# Patient Record
Sex: Female | Born: 1995 | Race: Black or African American | Hispanic: No | Marital: Single
Health system: Southern US, Community
[De-identification: ages and names within clinical notes are randomized; demographics above are authoritative.]

## PROBLEM LIST (undated history)

## (undated) ENCOUNTER — Inpatient Hospital Stay (HOSPITAL_COMMUNITY): Payer: Self-pay

## (undated) DIAGNOSIS — F32A Depression, unspecified: Secondary | ICD-10-CM

## (undated) DIAGNOSIS — F329 Major depressive disorder, single episode, unspecified: Secondary | ICD-10-CM

## (undated) DIAGNOSIS — F419 Anxiety disorder, unspecified: Secondary | ICD-10-CM

## (undated) DIAGNOSIS — J029 Acute pharyngitis, unspecified: Secondary | ICD-10-CM

## (undated) DIAGNOSIS — G43909 Migraine, unspecified, not intractable, without status migrainosus: Secondary | ICD-10-CM

## (undated) DIAGNOSIS — J45909 Unspecified asthma, uncomplicated: Secondary | ICD-10-CM

## (undated) HISTORY — DX: Acute pharyngitis, unspecified: J02.9

## (undated) HISTORY — DX: Major depressive disorder, single episode, unspecified: F32.9

## (undated) HISTORY — DX: Migraine, unspecified, not intractable, without status migrainosus: G43.909

## (undated) HISTORY — PX: CHOLECYSTECTOMY: SHX55

## (undated) HISTORY — DX: Depression, unspecified: F32.A

## (undated) HISTORY — PX: TYMPANOSTOMY TUBE PLACEMENT: SHX32

## (undated) HISTORY — DX: Unspecified asthma, uncomplicated: J45.909

---
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
## 2016-01-09 ENCOUNTER — Encounter (INDEPENDENT_AMBULATORY_CARE_PROVIDER_SITE_OTHER): Payer: Medicaid Other | Admitting: Neurology

## 2016-02-06 ENCOUNTER — Encounter (INDEPENDENT_AMBULATORY_CARE_PROVIDER_SITE_OTHER): Payer: Self-pay | Admitting: *Deleted

## 2016-02-11 NOTE — L&D Delivery Note (Signed)
Patient is 21 y.o. G1P0000 [redacted]w[redacted]d admitted for IOL 2/2 postdates. S/p IOL with foley bulb, cytotec, followed by Pitocin. AROM at 2030 (9/25).  Prenatal course also complicated by anxiety d/o, pseudotumor cerebri, GBS+.  Delivery Note At 8:38 AM a viable female was delivered via Vaginal, Spontaneous Delivery (Presentation: LOA).  APGAR: 0, 2,2; weight pending.   Placenta status: Intact.  Cord: 3V with the following complications: None.  Cord pH: pending  Anesthesia: Epidural  Episiotomy: None Lacerations: Sulcus;Labial bilateral Suture Repair: vicryl Est. Blood Loss (mL): 450  Upon arrival patient was complete and pushing. She pushed with good maternal effort to deliver a viable infant in cephalic position. Nuchal cord around neck, body, and leg,easily reduced. Baby delivered without difficulty (anterior shoulder delivered with ease), was noted to have good tone and place on maternal abdomen for oral suctioning, drying and stimulation. Baby noted to have poor tone and not breathing. Code APGAR called. Cord clamped and baby sent to warmer for resuscitation. Placenta delivered spontaneously with gentle cord traction. Fundus firm with massage and Pitocin. Perineum inspected and found to have bilateral labial and deep sulcal laceration, which was repaired with good hemostasis achieved. Counts of sharps, instruments, and lap pads were all correct.  Mom to postpartum.  Baby to NICU.  Caryl Ada, DO OB Fellow 11/05/2016, 9:29 AM

## 2016-02-21 ENCOUNTER — Ambulatory Visit (INDEPENDENT_AMBULATORY_CARE_PROVIDER_SITE_OTHER): Payer: Medicaid Other

## 2016-02-21 ENCOUNTER — Encounter: Payer: Self-pay | Admitting: *Deleted

## 2016-02-21 DIAGNOSIS — Z3201 Encounter for pregnancy test, result positive: Secondary | ICD-10-CM

## 2016-02-21 DIAGNOSIS — Z3200 Encounter for pregnancy test, result unknown: Secondary | ICD-10-CM

## 2016-02-21 LAB — POCT URINE PREGNANCY: Preg Test, Ur: POSITIVE — AB

## 2016-02-21 NOTE — Progress Notes (Signed)
Patient is in the office for pregnancy test, results positive, LMP 12-13-15, EDD 09-19-15. Patient is 4449w0d.

## 2016-03-03 ENCOUNTER — Encounter (HOSPITAL_COMMUNITY): Payer: Self-pay | Admitting: *Deleted

## 2016-03-03 ENCOUNTER — Inpatient Hospital Stay (HOSPITAL_COMMUNITY)
Admission: AD | Admit: 2016-03-03 | Discharge: 2016-03-03 | Disposition: A | Discharge status: Home / Self Care | Payer: Medicaid Other | Source: Ambulatory Visit | Attending: Obstetrics & Gynecology | Admitting: Obstetrics & Gynecology

## 2016-03-03 DIAGNOSIS — O99341 Other mental disorders complicating pregnancy, first trimester: Secondary | ICD-10-CM | POA: Insufficient documentation

## 2016-03-03 DIAGNOSIS — G43909 Migraine, unspecified, not intractable, without status migrainosus: Secondary | ICD-10-CM | POA: Diagnosis not present

## 2016-03-03 DIAGNOSIS — Z825 Family history of asthma and other chronic lower respiratory diseases: Secondary | ICD-10-CM | POA: Diagnosis not present

## 2016-03-03 DIAGNOSIS — Z8249 Family history of ischemic heart disease and other diseases of the circulatory system: Secondary | ICD-10-CM | POA: Insufficient documentation

## 2016-03-03 DIAGNOSIS — J45909 Unspecified asthma, uncomplicated: Secondary | ICD-10-CM | POA: Insufficient documentation

## 2016-03-03 DIAGNOSIS — Z833 Family history of diabetes mellitus: Secondary | ICD-10-CM | POA: Diagnosis not present

## 2016-03-03 DIAGNOSIS — Z7722 Contact with and (suspected) exposure to environmental tobacco smoke (acute) (chronic): Secondary | ICD-10-CM | POA: Insufficient documentation

## 2016-03-03 DIAGNOSIS — O209 Hemorrhage in early pregnancy, unspecified: Secondary | ICD-10-CM | POA: Insufficient documentation

## 2016-03-03 DIAGNOSIS — O99511 Diseases of the respiratory system complicating pregnancy, first trimester: Secondary | ICD-10-CM | POA: Insufficient documentation

## 2016-03-03 DIAGNOSIS — O4690 Antepartum hemorrhage, unspecified, unspecified trimester: Secondary | ICD-10-CM | POA: Diagnosis present

## 2016-03-03 DIAGNOSIS — O99351 Diseases of the nervous system complicating pregnancy, first trimester: Secondary | ICD-10-CM | POA: Insufficient documentation

## 2016-03-03 DIAGNOSIS — Z9109 Other allergy status, other than to drugs and biological substances: Secondary | ICD-10-CM | POA: Diagnosis not present

## 2016-03-03 DIAGNOSIS — Z820 Family history of epilepsy and other diseases of the nervous system: Secondary | ICD-10-CM | POA: Diagnosis not present

## 2016-03-03 DIAGNOSIS — Z818 Family history of other mental and behavioral disorders: Secondary | ICD-10-CM | POA: Diagnosis not present

## 2016-03-03 DIAGNOSIS — Z3A01 Less than 8 weeks gestation of pregnancy: Secondary | ICD-10-CM | POA: Insufficient documentation

## 2016-03-03 DIAGNOSIS — O26891 Other specified pregnancy related conditions, first trimester: Secondary | ICD-10-CM | POA: Diagnosis present

## 2016-03-03 DIAGNOSIS — O0281 Inappropriate change in quantitative human chorionic gonadotropin (hCG) in early pregnancy: Secondary | ICD-10-CM | POA: Diagnosis not present

## 2016-03-03 DIAGNOSIS — F329 Major depressive disorder, single episode, unspecified: Secondary | ICD-10-CM | POA: Insufficient documentation

## 2016-03-03 DIAGNOSIS — Z711 Person with feared health complaint in whom no diagnosis is made: Secondary | ICD-10-CM

## 2016-03-03 LAB — URINALYSIS, ROUTINE W REFLEX MICROSCOPIC
Bilirubin Urine: NEGATIVE
Glucose, UA: NEGATIVE mg/dL
Hgb urine dipstick: NEGATIVE
Ketones, ur: 20 mg/dL — AB
Leukocytes, UA: NEGATIVE
Nitrite: NEGATIVE
Protein, ur: NEGATIVE mg/dL
Specific Gravity, Urine: 1.017 (ref 1.005–1.030)
pH: 6 (ref 5.0–8.0)

## 2016-03-03 LAB — ABO/RH: ABO/RH(D): A POS

## 2016-03-03 LAB — HCG, QUANTITATIVE, PREGNANCY: hCG, Beta Chain, Quant, S: 15921 m[IU]/mL — ABNORMAL HIGH (ref <5)

## 2016-03-03 NOTE — MAU Note (Signed)
Pt seen @ [facility] yesterday - began cramping & bleeding on Saturday, had labwork & an U/S.  Was told she needed to F/U for more labwork.  Bleeding stopped about an hour after it started on Saturday.  Continues to have intermittent lower abd pain.

## 2016-03-03 NOTE — MAU Provider Note (Signed)
History     CSN: [id number]  Arrival date and time: 03/03/16 1356   First Provider Initiated Contact with Patient 03/03/16 1525      Chief Complaint  Patient presents with  . Abdominal Pain   HPI   Ms.Madison Guzman is a 21 y.o. female G1P0000 @ unknown gestation here in MAU for follow up. She was seen at another hospital over the weekend. She had sex on Saturday and shortly after started having vaginal bleeding. The bleeding was menstrual like and then stopped.  She went to [facility] on Saturday for a full workup. She had a quant that was 8,964. US showed IUP with gestational sac and yolk sac, no fetal pole. Gestational sac measured 6 weeks. She was told to follow up here for a beta hcg level today.   Currently she denies abdominal pain or vaginal bleeding   OB History    Gravida Para Term Preterm AB Living   1 0 0 0 0 0   SAB TAB Ectopic Multiple Live Births   0 0 0 0        Past Medical History:  Diagnosis Date  . Asthma   . Depression   . Migraine    Pt reports since age of 9  . Pharyngitis 01/08/2013   [facility] - strep neg    Past Surgical History:  Procedure Laterality Date  . TYMPANOSTOMY TUBE PLACEMENT Bilateral     Family History  Problem Relation Age of Onset  . Asthma Mother   . Migraines Mother   . Diabetes Maternal Grandmother   . Hyperlipidemia Maternal Grandmother   . Hypertension Maternal Grandmother   . Mental illness Maternal Grandmother   . Migraines Maternal Grandmother   . Seizures Father   . ADD / ADHD Brother     1 Younger brother has ADHD  . Mental illness Maternal Aunt     Bipolar  . ADD / ADHD Maternal Uncle     Social History  Substance Use Topics  . Smoking status: Passive Smoke Exposure - Never Smoker  . Smokeless tobacco: Never Used  . Alcohol use No    Allergies:  Allergies  Allergen Reactions  . Other     Seasonal    Prescriptions Prior to Admission  Medication Sig Dispense Refill Last Dose  .  albuterol (PROVENTIL HFA;VENTOLIN HFA) 108 (90 BASE) MCG/ACT inhaler Inhale 2 puffs by mouth every 4 hours as needed to treat wheezing (Patient not taking: Reported on 02/21/2016) 2 Inhaler 1 Not Taking  . amitriptyline (ELAVIL) 25 MG tablet TAKE 1& 1/2 TABLETS BY MOUTH AT BEDTIME (Patient not taking: Reported on 02/21/2016) 45 tablet 3 Not Taking  . lithium 300 MG tablet Take 300 mg by mouth 2 (two) times daily.   Not Taking  . Norethindrone-Ethinyl Estradiol-Fe Biphas (LO LOESTRIN FE) 1 MG-10 MCG / 10 MCG tablet Take 1 tablet by mouth daily. (Patient not taking: Reported on 02/21/2016) 1 Package 11 Not Taking  . Prenat w/o A-FeCbn-Meth-FA-DHA (PRENATE MINI) 29-0.6-0.4-350 MG CAPS Take 1 tablet by mouth daily. (Patient not taking: Reported on 02/21/2016) 30 capsule 12 Not Taking  . propranolol (INDERAL) 20 MG tablet Take 1 tablet (20 mg total) by mouth 2 (two) times daily. (Patient not taking: Reported on 02/21/2016) 60 tablet 1 Not Taking   Results for orders placed or performed during the hospital encounter of 03/03/16 (from the past 48 hour(s))  Urinalysis, Routine w reflex microscopic     Status: Abnormal  Collection Time: 03/03/16  2:20 PM  Result Value Ref Range   Color, Urine YELLOW YELLOW   APPearance HAZY (A) CLEAR   Specific Gravity, Urine 1.017 1.005 - 1.030   pH 6.0 5.0 - 8.0   Glucose, UA NEGATIVE NEGATIVE mg/dL   Hgb urine dipstick NEGATIVE NEGATIVE   Bilirubin Urine NEGATIVE NEGATIVE   Ketones, ur 20 (A) NEGATIVE mg/dL   Protein, ur NEGATIVE NEGATIVE mg/dL   Nitrite NEGATIVE NEGATIVE   Leukocytes, UA NEGATIVE NEGATIVE  hCG, quantitative, pregnancy     Status: Abnormal   Collection Time: 03/03/16  3:51 PM  Result Value Ref Range   hCG, Beta Chain, Quant, S 15,921 (H) <5 mIU/mL    Comment:          GEST. AGE      CONC.  (mIU/mL)   <=1 WEEK        5 - 50     2 WEEKS       50 - 500     3 WEEKS       100 - 10,000     4 WEEKS     1,000 - 30,000     5 WEEKS     3,500 - 115,000    6-8 WEEKS     12,000 - 270,000    12 WEEKS     15,000 - 220,000        FEMALE AND NON-PREGNANT FEMALE:     LESS THAN 5 mIU/mL   ABO/Rh     Status: None (Preliminary result)   Collection Time: 03/03/16  3:51 PM  Result Value Ref Range   ABO/RH(D) A POS     Review of Systems  Constitutional: Negative for chills and fever.  Gastrointestinal: Negative for abdominal pain.  Genitourinary: Negative for dysuria and vaginal bleeding.   Physical Exam   Blood pressure 129/76, pulse 96, temperature 98.6 F (37 C), temperature source Oral, resp. rate 18, height 5\' 4"  (1.626 m), weight 236 lb (107 kg), last menstrual period 12/13/2015.  Physical Exam  Constitutional: She is oriented to person, place, and time. She appears well-developed and well-nourished. No distress.  HENT:  Head: Normocephalic.  Respiratory: Effort normal.  GI: Soft.  Musculoskeletal: Normal range of motion.  Neurological: She is alert and oriented to person, place, and time.  Skin: Skin is warm. She is not diaphoretic.  Psychiatric: Her behavior is normal.    MAU Course  Procedures  None  MDM  A positive blood type.  Quant 16,10915,921. >60% rise in beta hcg level. Records reviewed in care everywhere.   Assessment and Plan   A:  1. Elevated level of quantitative hCG for gestational age in early pregnancy   2. Physically well but worried     P:  Discharge home in stable condition Start prenatal care.  Bleeding precautions  Return to MAU if symptoms worsen First trimester precautions   Duane LopeJennifer I Rasch, NP 03/03/2016 7:38 PM

## 2016-03-03 NOTE — Discharge Instructions (Signed)

## 2016-03-10 ENCOUNTER — Encounter: Payer: Medicaid Other | Admitting: Obstetrics and Gynecology

## 2016-03-11 ENCOUNTER — Encounter: Payer: Self-pay | Admitting: Obstetrics and Gynecology

## 2016-03-11 ENCOUNTER — Ambulatory Visit (INDEPENDENT_AMBULATORY_CARE_PROVIDER_SITE_OTHER): Payer: Medicaid Other | Admitting: Obstetrics and Gynecology

## 2016-03-11 ENCOUNTER — Other Ambulatory Visit (HOSPITAL_COMMUNITY)
Admission: RE | Admit: 2016-03-11 | Discharge: 2016-03-11 | Disposition: A | Discharge status: Home / Self Care | Payer: Medicaid Other | Source: Ambulatory Visit | Attending: Obstetrics and Gynecology | Admitting: Obstetrics and Gynecology

## 2016-03-11 VITALS — BP 124/76 | Wt 232.0 lb

## 2016-03-11 DIAGNOSIS — Z23 Encounter for immunization: Secondary | ICD-10-CM

## 2016-03-11 DIAGNOSIS — G932 Benign intracranial hypertension: Secondary | ICD-10-CM

## 2016-03-11 DIAGNOSIS — Z3689 Encounter for other specified antenatal screening: Secondary | ICD-10-CM | POA: Diagnosis not present

## 2016-03-11 DIAGNOSIS — A749 Chlamydial infection, unspecified: Secondary | ICD-10-CM

## 2016-03-11 DIAGNOSIS — J4540 Moderate persistent asthma, uncomplicated: Secondary | ICD-10-CM

## 2016-03-11 DIAGNOSIS — Z3490 Encounter for supervision of normal pregnancy, unspecified, unspecified trimester: Secondary | ICD-10-CM | POA: Insufficient documentation

## 2016-03-11 DIAGNOSIS — Z113 Encounter for screening for infections with a predominantly sexual mode of transmission: Secondary | ICD-10-CM | POA: Diagnosis present

## 2016-03-11 DIAGNOSIS — Z3401 Encounter for supervision of normal first pregnancy, first trimester: Secondary | ICD-10-CM | POA: Diagnosis not present

## 2016-03-11 DIAGNOSIS — O219 Vomiting of pregnancy, unspecified: Secondary | ICD-10-CM | POA: Insufficient documentation

## 2016-03-11 DIAGNOSIS — Z3A01 Less than 8 weeks gestation of pregnancy: Secondary | ICD-10-CM

## 2016-03-11 MED ORDER — DOXYLAMINE-PYRIDOXINE 10-10 MG PO TBEC: 2.0000 | DELAYED_RELEASE_TABLET | Freq: Every day | ORAL | 5 refills | Status: DC

## 2016-03-11 NOTE — Progress Notes (Signed)
Bedside U/S shows IUP with FHT of 145 BPM  CRL is 9.226mm  GA is 7w

## 2016-03-11 NOTE — Progress Notes (Signed)
Subjective:  Madison Guzman is a 21 y.o. G1P0000 at 6431w2d being seen today for her first OB visit. Some problems with N/V. H/O asthma, controlled without meds, H/O pseudo cerebri  followed by neurology.   She is currently monitored for the following issues for this high-risk pregnancy and has Depression; Asthma, moderate persistent, well-controlled; Allergic rhinitis; Migraine headache; Vitamin D deficiency; Migraine without aura and without status migrainosus, not intractable; Tension headache; Anxiety state, unspecified; Insomnia; Tinnitus; Chronic daily headache; Tinnitus of left ear; Pseudotumor cerebri; Pseudotumor cerebri syndrome; Supervision of normal pregnancy; and Nausea and vomiting during pregnancy on her problem list.  Patient reports nausea.   . Vag. Bleeding: Scant.  Movement: Absent. Denies leaking of fluid.   The following portions of the patient's history were reviewed and updated as appropriate: allergies, current medications, past family history, past medical history, past social history, past surgical history and problem list. Problem list updated.  Objective:   Vitals:   03/11/16 1503  BP: 124/76  Weight: 232 lb (105.2 kg)    Fetal Status: Fetal Heart Rate (bpm): 145   Movement: Absent     General:  Alert, oriented and cooperative. Patient is in no acute distress.  Skin: Skin is warm and dry. No rash noted.   Cardiovascular: Normal heart rate noted  Respiratory: Normal respiratory effort, no problems with respiration noted  Abdomen: Soft, gravid, appropriate for gestational age. Pain/Pressure: Present     Pelvic:  Cervical exam performed        Extremities: Normal range of motion.  Edema: None  Mental Status: Normal mood and affect. Normal behavior. Normal judgment and thought content.  Breast Sym supple no nipple d/c, masses or adenoapathy Urinalysis: Urine Protein: Negative Urine Glucose: Negative  Assessment and Plan:  Pregnancy: G1P0000 at 6731w2d  1. Less than [redacted]  weeks gestation of pregnancy Flu Vaccine Prenatal labs, first trimester screening and prenatal care reviewed - Prenatal Profile - Hemoglobin A1c - Culture, OB Urine - GC/Chlamydia probe amp ([facility] - Cystic fibrosis diagnostic study - Pain Mgmt, Profile 6 Conf w/o mM, U - Glucose  2. Encounter for supervision of normal first pregnancy in first trimester  - Sickle Cell Scr - US MFM Fetal Nuchal Translucency; Future  3. Pseudotumor cerebri syndrome   4. Asthma, moderate persistent, well-controlled   5. Nausea and vomiting during pregnancy Diet modification reviewed as well - Doxylamine-Pyridoxine (DICLEGIS) 10-10 MG TBEC; Take 2 tablets by mouth at bedtime. If symptoms persist, add one tablet in the morning and one in the afternoon  Dispense: 100 tablet; Refill: 5  Preterm labor symptoms and general obstetric precautions including but not limited to vaginal bleeding, contractions, leaking of fluid and fetal movement were reviewed in detail with the patient. Please refer to After Visit Summary for other counseling recommendations.  Return for OB visit.   Hermina StaggersMichael L Kelton Bultman, MD

## 2016-03-11 NOTE — Patient Instructions (Signed)
Morning Sickness Morning sickness is when you feel sick to your stomach (nauseous) during pregnancy. This nauseous feeling may or may not come with vomiting. It often occurs in the morning but can be a problem any time of day. Morning sickness is most common during the first trimester, but it may continue throughout pregnancy. While morning sickness is unpleasant, it is usually harmless unless you develop severe and continual vomiting (hyperemesis gravidarum). This condition requires more intense treatment. What are the causes? The cause of morning sickness is not completely known but seems to be related to normal hormonal changes that occur in pregnancy. What increases the risk? You are at greater risk if you:  Experienced nausea or vomiting before your pregnancy.  Had morning sickness during a previous pregnancy.  Are pregnant with more than one baby, such as twins. How is this treated? Do not use any medicines (prescription, over-the-counter, or herbal) for morning sickness without first talking to your health care provider. Your health care provider may prescribe or recommend:  Vitamin B6 supplements.  Anti-nausea medicines.  The herbal medicine ginger. Follow these instructions at home:  Only take over-the-counter or prescription medicines as directed by your health care provider.  Taking multivitamins before getting pregnant can prevent or decrease the severity of morning sickness in most women.  Eat a piece of dry toast or unsalted crackers before getting out of bed in the morning.  Eat five or six small meals a day.  Eat dry and bland foods (rice, baked potato). Foods high in carbohydrates are often helpful.  Do not drink liquids with your meals. Drink liquids between meals.  Avoid greasy, fatty, and spicy foods.  Get someone to cook for you if the smell of any food causes nausea and vomiting.  If you feel nauseous after taking prenatal vitamins, take the vitamins at  night or with a snack.  Snack on protein foods (nuts, yogurt, cheese) between meals if you are hungry.  Eat unsweetened gelatins for desserts.  Wearing an acupressure wristband (worn for sea sickness) may be helpful.  Acupuncture may be helpful.  Do not smoke.  Get a humidifier to keep the air in your house free of odors.  Get plenty of fresh air. Contact a health care provider if:  Your home remedies are not working, and you need medicine.  You feel dizzy or lightheaded.  You are losing weight. Get help right away if:  You have persistent and uncontrolled nausea and vomiting.  You pass out (faint). This information is not intended to replace advice given to you by your health care provider. Make sure you discuss any questions you have with your health care provider. Document Released: 03/20/2006 Document Revised: 07/05/2015 Document Reviewed: 07/14/2012 Elsevier Interactive Patient Education  2017 Else[employer] First Trimester of Pregnancy The first trimester of pregnancy is from week 1 until the end of week 12 (months 1 through 3). A week after a sperm fertilizes an egg, the egg will implant on the wall of the uterus. This embryo will begin to develop into a baby. Genes from you and your partner are forming the baby. The female genes determine whether the baby is a boy or a girl. At 6-8 weeks, the eyes and face are formed, and the heartbeat can be seen on ultrasound. At the end of 12 weeks, all the baby's organs are formed.  Now that you are pregnant, you will want to do everything you can to have a healthy baby. Two of the   most important things are to get good prenatal care and to follow your health care provider's instructions. Prenatal care is all the medical care you receive before the baby's birth. This care will help prevent, find, and treat any problems during the pregnancy and childbirth. BODY CHANGES Your body goes through many changes during pregnancy. The changes vary  from woman to woman.   You may gain or lose a couple of pounds at first.  You may feel sick to your stomach (nauseous) and throw up (vomit). If the vomiting is uncontrollable, call your health care provider.  You may tire easily.  You may develop headaches that can be relieved by medicines approved by your health care provider.  You may urinate more often. Painful urination may mean you have a bladder infection.  You may develop heartburn as a result of your pregnancy.  You may develop constipation because certain hormones are causing the muscles that push waste through your intestines to slow down.  You may develop hemorrhoids or swollen, bulging veins (varicose veins).  Your breasts may begin to grow larger and become tender. Your nipples may stick out more, and the tissue that surrounds them (areola) may become darker.  Your gums may bleed and may be sensitive to brushing and flossing.  Dark spots or blotches (chloasma, mask of pregnancy) may develop on your face. This will likely fade after the baby is born.  Your menstrual periods will stop.  You may have a loss of appetite.  You may develop cravings for certain kinds of food.  You may have changes in your emotions from day to day, such as being excited to be pregnant or being concerned that something may go wrong with the pregnancy and baby.  You may have more vivid and strange dreams.  You may have changes in your hair. These can include thickening of your hair, rapid growth, and changes in texture. Some women also have hair loss during or after pregnancy, or hair that feels dry or thin. Your hair will most likely return to normal after your baby is born. WHAT TO EXPECT AT YOUR PRENATAL VISITS During a routine prenatal visit:  You will be weighed to make sure you and the baby are growing normally.  Your blood pressure will be taken.  Your abdomen will be measured to track your baby's growth.  The fetal heartbeat  will be listened to starting around week 10 or 12 of your pregnancy.  Test results from any previous visits will be discussed. Your health care provider may ask you:  How you are feeling.  If you are feeling the baby move.  If you have had any abnormal symptoms, such as leaking fluid, bleeding, severe headaches, or abdominal cramping.  If you are using any tobacco products, including cigarettes, chewing tobacco, and electronic cigarettes.  If you have any questions. Other tests that may be performed during your first trimester include:  Blood tests to find your blood type and to check for the presence of any previous infections. They will also be used to check for low iron levels (anemia) and Rh antibodies. Later in the pregnancy, blood tests for diabetes will be done along with other tests if problems develop.  Urine tests to check for infections, diabetes, or protein in the urine.  An ultrasound to confirm the proper growth and development of the baby.  An amniocentesis to check for possible genetic problems.  Fetal screens for spina bifida and Down syndrome.  You may need   other tests to make sure you and the baby are doing well.  HIV (human immunodeficiency virus) testing. Routine prenatal testing includes screening for HIV, unless you choose not to have this test. HOME CARE INSTRUCTIONS  Medicines   Follow your health care provider's instructions regarding medicine use. Specific medicines may be either safe or unsafe to take during pregnancy.  Take your prenatal vitamins as directed.  If you develop constipation, try taking a stool softener if your health care provider approves. Diet   Eat regular, well-balanced meals. Choose a variety of foods, such as meat or vegetable-based protein, fish, milk and low-fat dairy products, vegetables, fruits, and whole grain breads and cereals. Your health care provider will help you determine the amount of weight gain that is right for  you.  Avoid raw meat and uncooked cheese. These carry germs that can cause birth defects in the baby.  Eating four or five small meals rather than three large meals a day may help relieve nausea and vomiting. If you start to feel nauseous, eating a few soda crackers can be helpful. Drinking liquids between meals instead of during meals also seems to help nausea and vomiting.  If you develop constipation, eat more high-fiber foods, such as fresh vegetables or fruit and whole grains. Drink enough fluids to keep your urine clear or pale yellow. Activity and Exercise   Exercise only as directed by your health care provider. Exercising will help you:  Control your weight.  Stay in shape.  Be prepared for labor and delivery.  Experiencing pain or cramping in the lower abdomen or low back is a good sign that you should stop exercising. Check with your health care provider before continuing normal exercises.  Try to avoid standing for long periods of time. Move your legs often if you must stand in one place for a long time.  Avoid heavy lifting.  Wear low-heeled shoes, and practice good posture.  You may continue to have sex unless your health care provider directs you otherwise. Relief of Pain or Discomfort   Wear a good support bra for breast tenderness.   Take warm sitz baths to soothe any pain or discomfort caused by hemorrhoids. Use hemorrhoid cream if your health care provider approves.   Rest with your legs elevated if you have leg cramps or low back pain.  If you develop varicose veins in your legs, wear support hose. Elevate your feet for 15 minutes, 3-4 times a day. Limit salt in your diet. Prenatal Care   Schedule your prenatal visits by the twelfth week of pregnancy. They are usually scheduled monthly at first, then more often in the last 2 months before delivery.  Write down your questions. Take them to your prenatal visits.  Keep all your prenatal visits as directed  by your health care provider. Safety   Wear your seat belt at all times when driving.  Make a list of emergency phone numbers, including numbers for family, friends, the hospital, and police and fire departments. General Tips   Ask your health care provider for a referral to a local prenatal education class. Begin classes no later than at the beginning of month 6 of your pregnancy.  Ask for help if you have counseling or nutritional needs during pregnancy. Your health care provider can offer advice or refer you to specialists for help with various needs.  Do not use hot tubs, steam rooms, or saunas.  Do not douche or use tampons or scented sanitary pads.    Do not cross your legs for long periods of time.  Avoid cat litter boxes and soil used by cats. These carry germs that can cause birth defects in the baby and possibly loss of the fetus by miscarriage or stillbirth.  Avoid all smoking, herbs, alcohol, and medicines not prescribed by your health care provider. Chemicals in these affect the formation and growth of the baby.  Do not use any tobacco products, including cigarettes, chewing tobacco, and electronic cigarettes. If you need help quitting, ask your health care provider. You may receive counseling support and other resources to help you quit.  Schedule a dentist appointment. At home, brush your teeth with a soft toothbrush and be gentle when you floss. SEEK MEDICAL CARE IF:   You have dizziness.  You have mild pelvic cramps, pelvic pressure, or nagging pain in the abdominal area.  You have persistent nausea, vomiting, or diarrhea.  You have a bad smelling vaginal discharge.  You have pain with urination.  You notice increased swelling in your face, hands, legs, or ankles. SEEK IMMEDIATE MEDICAL CARE IF:   You have a fever.  You are leaking fluid from your vagina.  You have spotting or bleeding from your vagina.  You have severe abdominal cramping or pain.  You  have rapid weight gain or loss.  You vomit blood or material that looks like coffee grounds.  You are exposed to German measles and have never had them.  You are exposed to fifth disease or chickenpox.  You develop a severe headache.  You have shortness of breath.  You have any kind of trauma, such as from a fall or a car accident. This information is not intended to replace advice given to you by your health care provider. Make sure you discuss any questions you have with your health care provider. Document Released: 01/21/2001 Document Revised: 02/17/2014 Document Reviewed: 12/07/2012 Elsevier Interactive Patient Education  2017 Elsevier Inc.  

## 2016-03-12 LAB — PRENATAL PROFILE (SOLSTAS)
Antibody Screen: NEGATIVE
Basophils Absolute: 0 cells/uL (ref 0–200)
Basophils Relative: 0 %
Eosinophils Absolute: 127 cells/uL (ref 15–500)
Eosinophils Relative: 1 %
HCT: 40.1 % (ref 35.0–45.0)
HIV 1&2 Ab, 4th Generation: NONREACTIVE
Hemoglobin: 13.4 g/dL (ref 11.7–15.5)
Hepatitis B Surface Ag: NEGATIVE
Lymphocytes Relative: 25 %
Lymphs Abs: 3175 cells/uL (ref 850–3900)
MCH: 28.2 pg (ref 27.0–33.0)
MCHC: 33.4 g/dL (ref 32.0–36.0)
MCV: 84.2 fL (ref 80.0–100.0)
MPV: 9.2 fL (ref 7.5–12.5)
Monocytes Absolute: 762 cells/uL (ref 200–950)
Monocytes Relative: 6 %
Neutro Abs: 8636 cells/uL — ABNORMAL HIGH (ref 1500–7800)
Neutrophils Relative %: 68 %
Platelets: 389 10*3/uL (ref 140–400)
RBC: 4.76 MIL/uL (ref 3.80–5.10)
RDW: 14.8 % (ref 11.0–15.0)
Rh Type: POSITIVE
Rubella: 1.04 Index — ABNORMAL HIGH (ref <0.90)
WBC: 12.7 10*3/uL — ABNORMAL HIGH (ref 3.8–10.8)

## 2016-03-12 LAB — HEMOGLOBIN A1C
Hgb A1c MFr Bld: 4.9 % (ref <5.7)
Mean Plasma Glucose: 94 mg/dL

## 2016-03-12 LAB — SICKLE CELL SCREEN: Sickle Cell Screen: NEGATIVE

## 2016-03-12 LAB — GLUCOSE, RANDOM: Glucose, Bld: 94 mg/dL (ref 65–99)

## 2016-03-13 LAB — PAIN MGMT, PROFILE 6 CONF W/O MM, U
6 Acetylmorphine: NEGATIVE ng/mL (ref <10)
Alcohol Metabolites: NEGATIVE ng/mL (ref <500)
Amphetamines: NEGATIVE ng/mL (ref <500)
Barbiturates: NEGATIVE ng/mL (ref <300)
Benzodiazepines: NEGATIVE ng/mL (ref <100)
Cocaine Metabolite: NEGATIVE ng/mL (ref <150)
Creatinine: >350 mg/dL (ref >=20.0)
Marijuana Metabolite: NEGATIVE ng/mL (ref <20)
Methadone Metabolite: NEGATIVE ng/mL (ref <100)
Opiates: NEGATIVE ng/mL (ref <100)
Oxidant: NEGATIVE ug/mL (ref <200)
Oxycodone: NEGATIVE ng/mL (ref <100)
Phencyclidine: NEGATIVE ng/mL (ref <25)
Please note:: 0
pH: 6.95 (ref 4.5–9.0)

## 2016-03-13 LAB — GC/CHLAMYDIA PROBE AMP (~~LOC~~) NOT AT ARMC
Chlamydia: POSITIVE — AB
Neisseria Gonorrhea: NEGATIVE

## 2016-03-13 LAB — CULTURE, OB URINE

## 2016-03-14 ENCOUNTER — Telehealth: Payer: Self-pay

## 2016-03-14 MED ORDER — AZITHROMYCIN 250 MG PO TABS: 250.0000 mg | ORAL_TABLET | Freq: Once | ORAL | 0 refills | Status: AC

## 2016-03-14 NOTE — Addendum Note (Signed)
Addended by: Kathie DikeSOLA, Eberardo Demello J on: 03/14/2016 08:19 AM   Modules accepted: Orders

## 2016-03-14 NOTE — Telephone Encounter (Signed)
I called pt to inform her that her results came back positive for Chlamydia. I told her I would be sending Azithromycin into her pharmacy. She informed me that she was seen at the hospital and they had already given her the medication. She took the medication two days prior to when we tested her for Chlamydia. Considering it takes about seven days to clear it up, I told her we would do a TOC in 4 weeks. I still informed her that her partner should be tested and treated if needed.

## 2016-03-21 LAB — CFVANTAGE CYSTIC FIB EXP SCREEN

## 2016-04-07 ENCOUNTER — Encounter (HOSPITAL_COMMUNITY): Payer: Self-pay | Admitting: Obstetrics and Gynecology

## 2016-04-08 ENCOUNTER — Other Ambulatory Visit (HOSPITAL_COMMUNITY)
Admission: RE | Admit: 2016-04-08 | Discharge: 2016-04-08 | Disposition: A | Discharge status: Home / Self Care | Payer: Medicaid Other | Source: Ambulatory Visit | Attending: Obstetrics & Gynecology | Admitting: Obstetrics & Gynecology

## 2016-04-08 ENCOUNTER — Encounter: Payer: Self-pay | Admitting: Obstetrics & Gynecology

## 2016-04-08 ENCOUNTER — Ambulatory Visit (INDEPENDENT_AMBULATORY_CARE_PROVIDER_SITE_OTHER): Payer: Medicaid Other | Admitting: Obstetrics & Gynecology

## 2016-04-08 VITALS — Wt 223.0 lb

## 2016-04-08 DIAGNOSIS — Z202 Contact with and (suspected) exposure to infections with a predominantly sexual mode of transmission: Secondary | ICD-10-CM

## 2016-04-08 DIAGNOSIS — A749 Chlamydial infection, unspecified: Secondary | ICD-10-CM

## 2016-04-08 DIAGNOSIS — O99211 Obesity complicating pregnancy, first trimester: Secondary | ICD-10-CM

## 2016-04-08 DIAGNOSIS — Z113 Encounter for screening for infections with a predominantly sexual mode of transmission: Secondary | ICD-10-CM | POA: Insufficient documentation

## 2016-04-08 DIAGNOSIS — O99210 Obesity complicating pregnancy, unspecified trimester: Secondary | ICD-10-CM

## 2016-04-08 DIAGNOSIS — O98811 Other maternal infectious and parasitic diseases complicating pregnancy, first trimester: Secondary | ICD-10-CM

## 2016-04-08 DIAGNOSIS — Z3401 Encounter for supervision of normal first pregnancy, first trimester: Secondary | ICD-10-CM

## 2016-04-08 DIAGNOSIS — E669 Obesity, unspecified: Secondary | ICD-10-CM

## 2016-04-08 DIAGNOSIS — O98311 Other infections with a predominantly sexual mode of transmission complicating pregnancy, first trimester: Secondary | ICD-10-CM

## 2016-04-08 MED ORDER — PROMETHAZINE HCL 25 MG PO TABS: 25.0000 mg | ORAL_TABLET | Freq: Four times a day (QID) | ORAL | 2 refills | Status: DC | PRN

## 2016-04-08 NOTE — Progress Notes (Signed)
Pt states that she had some slight bleeding since the last time she was here but hasn't had it lately.  --Madison Guzman, CMA   PRENATAL VISIT NOTE  Subjective:  Madison Guzman is a 21 y.o. G1P0000 at 4840w3d being seen today for ongoing prenatal care.  She is currently monitored for the following issues for this high-risk pregnancy and has Depression; Asthma, moderate persistent, well-controlled; Allergic rhinitis; Migraine headache; Vitamin D deficiency; Migraine without aura and without status migrainosus, not intractable; Tension headache; Anxiety state, unspecified; Insomnia; Tinnitus; Chronic daily headache; Tinnitus of left ear; Pseudotumor cerebri; Pseudotumor cerebri syndrome; Supervision of normal pregnancy; Nausea and vomiting during pregnancy; and Chlamydia infection affecting pregnancy on her problem list.  Patient reports light bleeding since last visit.  None currently.  Nausea and coudln't afford medications..   [name]. Vag. Bleeding: None.  Movement: Absent. Denies leaking of fluid.   The following portions of the patient's history were reviewed and updated as appropriate: allergies, current medications, past family history, past medical history, past social history, past surgical history and problem list. Problem list updated.  Objective:   Vitals:   04/08/16 1332  Weight: 223 lb (101.2 kg)    Fetal Status: Fetal Heart Rate (bpm): 160   Movement: Absent     General:  Alert, oriented and cooperative. Patient is in no acute distress.  Skin: Skin is warm and dry. No rash noted.   Cardiovascular: Normal heart rate noted  Respiratory: Normal respiratory effort, no problems with respiration noted  Abdomen: Soft, gravid, appropriate for gestational age. Pain/Pressure: Present     Pelvic:  Cervical exam deferred        Extremities: Normal range of motion.  Edema: None  Mental Status: Normal mood and affect. Normal behavior. Normal judgment and thought content.   Assessment and Plan:   Pregnancy: G1P0000 at 2940w3d  1. Encounter for supervision of normal first pregnancy in first trimester -First screen ordered -Phenergan ordered; if makes too tired will order Zofran.  Working on [employer] approval for [employer].  2. Exposure to chlamydia - GC[facility]Chlamydia [facility] amp [facility]  3.  Early glucola -Obese and history of elevated hgb A1C  4.  Headaches Refer to Donnella BiKaren Teague Clak   Preterm labor symptoms and general obstetric precautions including but not limited to vaginal bleeding, contractions, leaking of fluid and fetal movement were reviewed in detail with the patient. Please refer to After Visit Summary for other counseling recommendations.  Return in about 4 weeks (around 05/06/2016).   Madison DukesKelly H Sharline Lehane, MD

## 2016-04-09 DIAGNOSIS — O98819 Other maternal infectious and parasitic diseases complicating pregnancy, unspecified trimester: Secondary | ICD-10-CM

## 2016-04-09 DIAGNOSIS — A749 Chlamydial infection, unspecified: Secondary | ICD-10-CM | POA: Insufficient documentation

## 2016-04-10 LAB — GC/CHLAMYDIA PROBE AMP (~~LOC~~) NOT AT ARMC
Chlamydia: NEGATIVE
Neisseria Gonorrhea: NEGATIVE

## 2016-04-17 ENCOUNTER — Ambulatory Visit (HOSPITAL_COMMUNITY)
Admission: RE | Admit: 2016-04-17 | Discharge: 2016-04-17 | Disposition: A | Discharge status: Home / Self Care | Payer: Medicaid Other | Source: Ambulatory Visit | Attending: Obstetrics and Gynecology | Admitting: Obstetrics and Gynecology

## 2016-04-17 ENCOUNTER — Encounter (HOSPITAL_COMMUNITY): Payer: Self-pay

## 2016-04-17 DIAGNOSIS — Z3682 Encounter for antenatal screening for nuchal translucency: Secondary | ICD-10-CM | POA: Insufficient documentation

## 2016-04-17 DIAGNOSIS — O99211 Obesity complicating pregnancy, first trimester: Secondary | ICD-10-CM | POA: Diagnosis not present

## 2016-04-17 DIAGNOSIS — Z3401 Encounter for supervision of normal first pregnancy, first trimester: Secondary | ICD-10-CM

## 2016-04-17 DIAGNOSIS — Z3A12 12 weeks gestation of pregnancy: Secondary | ICD-10-CM | POA: Diagnosis not present

## 2016-04-21 ENCOUNTER — Other Ambulatory Visit: Payer: Self-pay

## 2016-04-22 NOTE — Progress Notes (Signed)
Erroneous encounter

## 2016-05-02 ENCOUNTER — Inpatient Hospital Stay (HOSPITAL_COMMUNITY)
Admission: AD | Admit: 2016-05-02 | Discharge: 2016-05-03 | Disposition: A | Discharge status: Home / Self Care | Payer: Medicaid Other | Source: Ambulatory Visit | Attending: Obstetrics & Gynecology | Admitting: Obstetrics & Gynecology

## 2016-05-02 ENCOUNTER — Encounter (HOSPITAL_COMMUNITY): Payer: Self-pay

## 2016-05-02 DIAGNOSIS — O26892 Other specified pregnancy related conditions, second trimester: Secondary | ICD-10-CM | POA: Insufficient documentation

## 2016-05-02 DIAGNOSIS — O210 Mild hyperemesis gravidarum: Secondary | ICD-10-CM | POA: Insufficient documentation

## 2016-05-02 DIAGNOSIS — R109 Unspecified abdominal pain: Secondary | ICD-10-CM | POA: Diagnosis present

## 2016-05-02 DIAGNOSIS — Z7722 Contact with and (suspected) exposure to environmental tobacco smoke (acute) (chronic): Secondary | ICD-10-CM | POA: Diagnosis not present

## 2016-05-02 DIAGNOSIS — O219 Vomiting of pregnancy, unspecified: Secondary | ICD-10-CM

## 2016-05-02 DIAGNOSIS — Z3A14 14 weeks gestation of pregnancy: Secondary | ICD-10-CM | POA: Diagnosis not present

## 2016-05-02 LAB — URINALYSIS, ROUTINE W REFLEX MICROSCOPIC
Bilirubin Urine: NEGATIVE
Glucose, UA: NEGATIVE mg/dL
Hgb urine dipstick: NEGATIVE
Ketones, ur: NEGATIVE mg/dL
Leukocytes, UA: NEGATIVE
Nitrite: NEGATIVE
Protein, ur: NEGATIVE mg/dL
Specific Gravity, Urine: 1.01 (ref 1.005–1.030)
pH: 6 (ref 5.0–8.0)

## 2016-05-02 NOTE — MAU Note (Signed)
Last night I had some cramping in lower abd. Today was worse and esp tonight. More on L side tonight. Hurts worse with movement and feels like I can't breathe. Have to take deep breaths but am not having trouble breathing. Pain "is annoying". Denies vag d/c or bleeding

## 2016-05-03 DIAGNOSIS — R109 Unspecified abdominal pain: Secondary | ICD-10-CM | POA: Diagnosis not present

## 2016-05-03 DIAGNOSIS — O26892 Other specified pregnancy related conditions, second trimester: Secondary | ICD-10-CM

## 2016-05-03 LAB — WET PREP, GENITAL
Clue Cells Wet Prep HPF POC: NONE SEEN
Sperm: NONE SEEN
Trich, Wet Prep: NONE SEEN
Yeast Wet Prep HPF POC: NONE SEEN

## 2016-05-03 LAB — CBC
HCT: 38.3 % (ref 36.0–46.0)
Hemoglobin: 13.3 g/dL (ref 12.0–15.0)
MCH: 28.4 pg (ref 26.0–34.0)
MCHC: 34.7 g/dL (ref 30.0–36.0)
MCV: 81.8 fL (ref 78.0–100.0)
Platelets: 294 10*3/uL (ref 150–400)
RBC: 4.68 MIL/uL (ref 3.87–5.11)
RDW: 14.1 % (ref 11.5–15.5)
WBC: 13.7 10*3/uL — ABNORMAL HIGH (ref 4.0–10.5)

## 2016-05-03 MED ORDER — ONDANSETRON HCL 4 MG PO TABS: 4.0000 mg | ORAL_TABLET | Freq: Three times a day (TID) | ORAL | 0 refills | Status: DC | PRN

## 2016-05-03 MED ORDER — PROMETHAZINE HCL 25 MG PO TABS
25.0000 mg | ORAL_TABLET | Freq: Once | ORAL | Status: AC
  Administered 2016-05-03: 25 mg via ORAL
  Filled 2016-05-03: qty 1

## 2016-05-03 NOTE — MAU Provider Note (Signed)
Chief Complaint: Abdominal Pain   None     SUBJECTIVE HPI: Madison Guzman is a 21 y.o. G1P0000 at [redacted]w[redacted]d by LMP pt of Kathryne Sharper who presents to maternity admissions reporting onset of abdominal cramping 2 days ago worsening tonight.  Her pain in in the center of her lower pelvis, but slightly more on the left lower side and is radiating in to her lower back.  She has nausea and takes Phenergan 25 mg PO Q 6 hours PRN but her nausea is unchanged and she denies recent vomiting.  There are no other associated symptoms. She has tried drinking more water and Tylenol but nothing has helped.  She denies vaginal bleeding, vaginal itching/burning, urinary symptoms, h/a, dizziness, or fever/chills.     HPI  Past Medical History:  Diagnosis Date  . Asthma   . Depression   . Migraine    Pt reports since age of 23  . Pharyngitis 01/08/2013   [facility] - strep neg   Past Surgical History:  Procedure Laterality Date  . TYMPANOSTOMY TUBE PLACEMENT Bilateral    Social History   Social History  . Marital status: Single    Spouse name: N/A  . Number of children: N/A  . Years of education: N/A   Occupational History  . Not on file.   Social History Main Topics  . Smoking status: Passive Smoke Exposure - Never Smoker  . Smokeless tobacco: Never Used  . Alcohol use No  . Drug use: No  . Sexual activity: Not Currently    Birth control/ protection: None     Comment: Patient stated that she does not always take the [facility] pill as prescribed 04/12/14 TW   Other Topics Concern  . Not on file   Social History Narrative   Kyona attends [employer]. She is doing well.   Generally stays at maternal grandmothers home across from parents.  Has her own room there and the 2 households interact well.   No current facility-administered medications on file prior to encounter.    Current Outpatient Prescriptions on File Prior to Encounter  Medication Sig Dispense Refill  .  promethazine (PHENERGAN) 25 MG tablet Take 1 tablet (25 mg total) by mouth every 6 (six) hours as needed for nausea or vomiting. 30 tablet 2  . albuterol (PROVENTIL HFA;VENTOLIN HFA) 108 (90 BASE) MCG/ACT inhaler Inhale 2 puffs by mouth every 4 hours as needed to treat wheezing (Patient not taking: Reported on 03/11/2016) 2 Inhaler 1  . Doxylamine-Pyridoxine (DICLEGIS) 10-10 MG TBEC Take 2 tablets by mouth at bedtime. If symptoms persist, add one tablet in the morning and one in the afternoon (Patient not taking: Reported on 04/08/2016) 100 tablet 5  . Prenat w/o A-FeCbn-Meth-FA-DHA (PRENATE MINI) 29-0.6-0.4-350 MG CAPS Take 1 tablet by mouth daily. (Patient not taking: Reported on 02/21/2016) 30 capsule 12   No Known Allergies  ROS:  Review of Systems  Constitutional: Negative for chills, fatigue and fever.  Respiratory: Negative for shortness of breath.   Cardiovascular: Negative for chest pain.  Gastrointestinal: Positive for abdominal pain, nausea and vomiting. Negative for constipation.  Genitourinary: Positive for pelvic pain. Negative for difficulty urinating, dysuria, flank pain, vaginal bleeding, vaginal discharge and vaginal pain.  Musculoskeletal: Positive for back pain.  Neurological: Negative for dizziness and headaches.  Psychiatric/Behavioral: Negative.      I have reviewed patient's Past Medical Hx, Surgical Hx, Family Hx, Social Hx, medications and allergies.   Physical Exam  Patient Vitals  for the past 24 hrs:  BP Temp Pulse Resp SpO2 Height Weight  05/02/16 2047 - - - - 100 % - -  05/02/16 2044 137/73 98.7 F (37.1 C) 82 18 - 5\' 4"  (1.626 m) 226 lb (102.5 kg)   Constitutional: Well-developed, well-nourished female in no acute distress.  Cardiovascular: normal rate Respiratory: normal effort GI: Abd soft, non-tender. Pos BS x 4 MS: Extremities nontender, no edema, normal ROM Neurologic: Alert and oriented x 4.  GU: Neg CVAT.  PELVIC EXAM: Cervix pink, visually  closed, without lesion, scant white creamy discharge, vaginal walls and external genitalia normal Bimanual exam: Cervix 0/long/high, firm, anterior, neg CMT, uterus nontender, ~14 week size, adnexa without tenderness, enlargement, or mass  FHT 140 by doppler  LAB RESULTS  Results for orders placed or performed during the hospital encounter of 05/02/16 (from the past 48 hour(s))  Urinalysis, Routine w reflex microscopic     Status: Abnormal   Collection Time: 05/02/16  8:50 PM  Result Value Ref Range   Color, Urine YELLOW YELLOW   APPearance HAZY (A) CLEAR   Specific Gravity, Urine 1.010 1.005 - 1.030   pH 6.0 5.0 - 8.0   Glucose, UA NEGATIVE NEGATIVE mg/dL   Hgb urine dipstick NEGATIVE NEGATIVE   Bilirubin Urine NEGATIVE NEGATIVE   Ketones, ur NEGATIVE NEGATIVE mg/dL   Protein, ur NEGATIVE NEGATIVE mg/dL   Nitrite NEGATIVE NEGATIVE   Leukocytes, UA NEGATIVE NEGATIVE  Wet prep, genital     Status: Abnormal   Collection Time: 05/03/16  1:55 AM  Result Value Ref Range   Yeast Wet Prep HPF POC NONE SEEN NONE SEEN   Trich, Wet Prep NONE SEEN NONE SEEN   Clue Cells Wet Prep HPF POC NONE SEEN NONE SEEN   WBC, Wet Prep HPF POC FEW (A) NONE SEEN    Comment: MODERATE BACTERIA SEEN   Sperm NONE SEEN   CBC     Status: Abnormal   Collection Time: 05/03/16  2:50 AM  Result Value Ref Range   WBC 13.7 (H) 4.0 - 10.5 K/uL   RBC 4.68 3.87 - 5.11 MIL/uL   Hemoglobin 13.3 12.0 - 15.0 g/dL   HCT 16.1 09.6 - 04.5 %   MCV 81.8 78.0 - 100.0 fL   MCH 28.4 26.0 - 34.0 pg   MCHC 34.7 30.0 - 36.0 g/dL   RDW 40.9 81.1 - 91.4 %   Platelets 294 150 - 400 K/uL   A/POS/-- (01/30 1516)  IMAGING   MAU Management/MDM: Ordered labs and reviewed results.  No evidence of acute abdomen or infection. GCC pending.  Likely musculoskeletal pain. May be related to frequent vomiting.  Rest/ice/heat/Tylenol for pain.  Zofran Rx added for nausea.  F/U at [facility] this week if pain persists.  Return to MAU as  needed for emergencies.  Pt stable at time of discharge.  ASSESSMENT  1. Abdominal pain during pregnancy in second trimester   2. Nausea and vomiting during pregnancy prior to [redacted] weeks gestation    PLAN Discharge home    Allergies as of 05/03/2016   No Known Allergies     Medication List    STOP taking these medications   Doxylamine-Pyridoxine 10-10 MG Tbec Commonly known as:  DICLEGIS     TAKE these medications   albuterol 108 (90 Base) MCG/ACT inhaler Commonly known as:  PROVENTIL HFA;VENTOLIN HFA Inhale 2 puffs by mouth every 4 hours as needed to treat wheezing   ondansetron 4 MG tablet Commonly  known as:  ZOFRAN Take 1 tablet (4 mg total) by mouth every 8 (eight) hours as needed for nausea or vomiting.   PRENATE MINI 29-0.6-0.4-350 MG Caps Take 1 tablet by mouth daily.   promethazine 25 MG tablet Commonly known as:  PHENERGAN Take 1 tablet (25 mg total) by mouth every 6 (six) hours as needed for nausea or vomiting.       Sharen CounterLisa Leftwich-Kirby Certified Nurse-Midwife 05/03/2016  1:59 AM

## 2016-05-04 LAB — URINE CULTURE

## 2016-05-05 LAB — GC/CHLAMYDIA PROBE AMP (~~LOC~~) NOT AT ARMC
Chlamydia: NEGATIVE
Neisseria Gonorrhea: NEGATIVE

## 2016-05-06 ENCOUNTER — Ambulatory Visit (INDEPENDENT_AMBULATORY_CARE_PROVIDER_SITE_OTHER): Payer: Medicaid Other | Admitting: Obstetrics & Gynecology

## 2016-05-06 VITALS — BP 118/82 | HR 110 | Wt 222.0 lb

## 2016-05-06 DIAGNOSIS — O09622 Supervision of young multigravida, second trimester: Secondary | ICD-10-CM | POA: Diagnosis not present

## 2016-05-06 DIAGNOSIS — O26812 Pregnancy related exhaustion and fatigue, second trimester: Secondary | ICD-10-CM | POA: Diagnosis not present

## 2016-05-06 DIAGNOSIS — R5383 Other fatigue: Secondary | ICD-10-CM

## 2016-05-06 DIAGNOSIS — Z3402 Encounter for supervision of normal first pregnancy, second trimester: Secondary | ICD-10-CM

## 2016-05-06 DIAGNOSIS — Z3689 Encounter for other specified antenatal screening: Secondary | ICD-10-CM

## 2016-05-06 MED ORDER — RANITIDINE HCL 150 MG PO TABS: 150.0000 mg | ORAL_TABLET | Freq: Two times a day (BID) | ORAL | 4 refills | Status: DC

## 2016-05-06 NOTE — Progress Notes (Signed)
Continues to be nauseated even with Zofran    PRENATAL VISIT NOTE  Subjective:  Madison Guzman is a 21 y.o. G1P0000 at 2427w2d being seen today for ongoing prenatal care.  She is currently monitored for the following issues for this low-risk pregnancy and has Depression; Asthma, moderate persistent, well-controlled; Allergic rhinitis; Vitamin D deficiency; Migraine without aura and without status migrainosus, not intractable; Tension headache; Anxiety state, unspecified; Insomnia; Tinnitus; Chronic daily headache; Tinnitus of left ear; Pseudotumor cerebri; Pseudotumor cerebri syndrome; Supervision of normal pregnancy; Nausea and vomiting during pregnancy; and Chlamydia infection affecting pregnancy on her problem list.  Patient reports continued nausea and vomiting, headaches, adn refulx..   [name]. Vag. Bleeding: None.  Movement: Absent. Denies leaking of fluid.   The following portions of the patient's history were reviewed and updated as appropriate: allergies, current medications, past family history, past medical history, past social history, past surgical history and problem list. Problem list updated.  Objective:   Vitals:   05/06/16 1402  BP: 118/82  Pulse: (!) 110  Weight: 222 lb (100.7 kg)    Fetal Status: Fetal Heart Rate (bpm): 145   Movement: Absent     General:  Alert, oriented and cooperative. Patient is in no acute distress.  Skin: Skin is warm and dry. No rash noted.   Cardiovascular: Normal heart rate noted  Respiratory: Normal respiratory effort, no problems with respiration noted  Abdomen: Soft, gravid, appropriate for gestational age. Pain/Pressure: Absent     Pelvic:  Cervical exam deferred        Extremities: Normal range of motion.  Edema: None  Mental Status: Normal mood and affect. Normal behavior. Normal judgment and thought content.   Assessment and Plan:  Pregnancy: G1P0000 at 7827w2d  1. Fatigue, unspecified type - TSH  2. Screening, antenatal, for fetal  anatomic survey - US MFM OB COMP + 14 WK; Future  3. Encounter for supervision of normal first pregnancy in second trimester - Alpha fetoprotein, maternal  4.  Headaches -peppermint oil for aromatherapy, biofreeze, and icepacks; has appt with Smitty CordsKaren T-C in April  5.  N/V / Reflux -Zantac for reflux symptoms; if not successful, then will start PPI -Only vomiting once a day.  Continue zofran and phenergan  Preterm labor symptoms and general obstetric precautions including but not limited to vaginal bleeding, contractions, leaking of fluid and fetal movement were reviewed in detail with the patient. Please refer to After Visit Summary for other counseling recommendations.  Return in about 4 weeks (around 06/03/2016).   Lesly DukesKelly H Leggett, MD

## 2016-05-07 LAB — ALPHA FETOPROTEIN, MATERNAL
AFP: 33.4 ng/mL
Curr Gest Age: 15.3 weeks
MoM for AFP: 1.25
Open Spina bifida: NEGATIVE
Osb Risk: 1:11800 {titer}

## 2016-05-07 LAB — TSH: TSH: 1.75 mIU/L (ref 0.50–4.30)

## 2016-05-12 ENCOUNTER — Telehealth: Payer: Self-pay | Admitting: *Deleted

## 2016-05-12 NOTE — Telephone Encounter (Signed)
Telephone call to patient to notify her of normal lab results.  No answer or machine at home number.  Cell number is no longer valid.  Will inform patient at her next visit of her lab results.

## 2016-05-23 ENCOUNTER — Ambulatory Visit (INDEPENDENT_AMBULATORY_CARE_PROVIDER_SITE_OTHER): Payer: Medicaid Other | Admitting: Physician Assistant

## 2016-05-23 ENCOUNTER — Encounter: Payer: Self-pay | Admitting: Physician Assistant

## 2016-05-23 VITALS — BP 124/79 | HR 90 | Resp 18 | Ht 64.0 in | Wt 221.0 lb

## 2016-05-23 DIAGNOSIS — R51 Headache: Secondary | ICD-10-CM

## 2016-05-23 DIAGNOSIS — O26892 Other specified pregnancy related conditions, second trimester: Secondary | ICD-10-CM

## 2016-05-23 DIAGNOSIS — R519 Headache, unspecified: Secondary | ICD-10-CM | POA: Insufficient documentation

## 2016-05-23 DIAGNOSIS — G43009 Migraine without aura, not intractable, without status migrainosus: Secondary | ICD-10-CM

## 2016-05-23 DIAGNOSIS — G932 Benign intracranial hypertension: Secondary | ICD-10-CM

## 2016-05-23 DIAGNOSIS — O26899 Other specified pregnancy related conditions, unspecified trimester: Secondary | ICD-10-CM | POA: Insufficient documentation

## 2016-05-23 DIAGNOSIS — G4700 Insomnia, unspecified: Secondary | ICD-10-CM

## 2016-05-23 MED ORDER — PROMETHAZINE HCL 25 MG RE SUPP: 25.0000 mg | Freq: Four times a day (QID) | RECTAL | 0 refills | Status: DC | PRN

## 2016-05-23 MED ORDER — CYCLOBENZAPRINE HCL 10 MG PO TABS: 10.0000 mg | ORAL_TABLET | Freq: Three times a day (TID) | ORAL | 1 refills | Status: DC | PRN

## 2016-05-23 NOTE — Patient Instructions (Signed)

## 2016-05-23 NOTE — Progress Notes (Signed)
History:  Madison Guzman is a 21 y.o. G1P0000 at 17 weeks who presents to clinic today for HA management.  She was seen by Dr. Merri Guzman for many years and diagnosed with pseudotumor cerebri as well as migraine.  She was managed on propanolol and amitriptyline until late last year.  She was also on a diuretic prior to those.  She was also seen by Dr. Mercy Guzman for bipolar disorder.   The migraines are severe, located left side frontal/temple.  There is pulsating, worse with movement, associated with dizziness, photophobia, phonophobia, dark spots in peripheral vision, nausea and vomiting.   At the beginning of pregnancy, the HAs were better but around week 12, they became worse again.  Tylenol not helpful.  Does not feel comfortable taking medications.  Is using phenergan every few days for nausea (was previously using more often). She has clinched her teeth/jaw and has noticed this contributing to HA.  She has muscle tension in neck/shoulders.  HIT6:70 Number of days in the last 4 weeks with:  Severe headache: 13 Moderate headache: 0 Mild headache: 4  No headache: 11   Past Medical History:  Diagnosis Date  . Asthma   . Depression   . Migraine    Pt reports since age of 16  . Pharyngitis 01/08/2013   [facility] - strep neg    Social History   Social History  . Marital status: Single    Spouse name: N/A  . Number of children: N/A  . Years of education: N/A   Occupational History  . Not on file.   Social History Main Topics  . Smoking status: Passive Smoke Exposure - Never Smoker  . Smokeless tobacco: Never Used  . Alcohol use No  . Drug use: No  . Sexual activity: Not Currently    Birth control/ protection: None     Comment: Patient stated that she does not always take the Madison Guzman pill as prescribed 04/12/14 TW   Other Topics Concern  . Not on file   Social History Narrative   Madison Guzman attends [employer]. She is doing well.   Generally stays at maternal  grandmothers home across from parents.  Has her own room there and the 2 households interact well.    Family History  Problem Relation Age of Onset  . Asthma Mother   . Migraines Mother   . Diabetes Maternal Grandmother   . Hyperlipidemia Maternal Grandmother   . Hypertension Maternal Grandmother   . Mental illness Maternal Grandmother   . Migraines Maternal Grandmother   . Seizures Father   . ADD / ADHD Brother     1 Younger brother has ADHD  . Mental illness Maternal Aunt     Bipolar  . ADD / ADHD Maternal Uncle     No Known Allergies  Current Outpatient Prescriptions on File Prior to Visit  Medication Sig Dispense Refill  . albuterol (PROVENTIL HFA;VENTOLIN HFA) 108 (90 BASE) MCG/ACT inhaler Inhale 2 puffs by mouth every 4 hours as needed to treat wheezing 2 Inhaler 1  . promethazine (PHENERGAN) 25 MG tablet Take 1 tablet (25 mg total) by mouth every 6 (six) hours as needed for nausea or vomiting. 30 tablet 2  . ranitidine (ZANTAC) 150 MG tablet Take 1 tablet (150 mg total) by mouth 2 (two) times daily. 60 tablet 4  . ondansetron (ZOFRAN) 4 MG tablet Take 1 tablet (4 mg total) by mouth every 8 (eight) hours as needed for nausea or vomiting. (  Patient not taking: Reported on 05/23/2016) 20 tablet 0  . Prenat w/o A-FeCbn-Meth-FA-DHA (PRENATE MINI) 29-0.6-0.4-350 MG CAPS Take 1 tablet by mouth daily. (Patient not taking: Reported on 05/23/2016) 30 capsule 12   No current facility-administered medications on file prior to visit.      Review of Systems:  All pertinent positive/negative included in HPI, all other review of systems are negative   Objective:  Physical Exam BP 124/79 (BP Location: Left Arm, Patient Position: Sitting, Cuff Size: Large)   Pulse 90   Resp 18   Ht  (1.626 m)   Wt 221 lb (100.2 kg)   LMP 12/13/2015   BMI 37.93 kg/m  CONSTITUTIONAL: Well-developed, well-nourished female in no acute distress.  EYES: EOM intact ENT: Normocephalic CARDIOVASCULAR:  Regular rate   RESPIRATORY: Normal rate.  MUSCULOSKELETAL: Normal ROM, strength equal bilaterally SKIN: Warm, dry without erythema  NEUROLOGICAL: Alert, oriented, CN II-XII grossly intact, Appropriate balance PSYCH: Normal behavior, mood   Assessment & Plan:  Assessment: 1. Migraine without aura and without status migrainosus, not intractable   2. Pseudotumor cerebri   3. Insomnia, unspecified type   4. Pregnancy headache, antepartum      Plan: Flexeril up to TID Phenergan - for nausea and/or headache = add suppository for when that is needed OTC options: biofreeze, tylenol, benadryl = careful of sedation for all of the above  - do not combine all together exercise Follow-upPRN Nausea medication to take during pregnancy:   Unisom (doxylamine succinate 25 mg tablets) Take one tablet daily at bedtime. If symptoms are not adequately controlled, the dose can be increased to a maximum recommended dose of two tablets daily (1/2 tablet in the morning, 1/2 tablet mid-afternoon and one at bedtime).  Vitamin B6  tablets. Take one tablet twice a day (up to 200 mg per day). Bertram Denver, PA-C 05/23/2016 10:23 AM

## 2016-06-03 ENCOUNTER — Ambulatory Visit (INDEPENDENT_AMBULATORY_CARE_PROVIDER_SITE_OTHER): Payer: Medicaid Other | Admitting: Obstetrics & Gynecology

## 2016-06-03 VITALS — BP 114/73 | HR 103 | Wt 225.0 lb

## 2016-06-03 DIAGNOSIS — O99210 Obesity complicating pregnancy, unspecified trimester: Secondary | ICD-10-CM | POA: Insufficient documentation

## 2016-06-03 DIAGNOSIS — Z3402 Encounter for supervision of normal first pregnancy, second trimester: Secondary | ICD-10-CM

## 2016-06-03 DIAGNOSIS — O99212 Obesity complicating pregnancy, second trimester: Secondary | ICD-10-CM

## 2016-06-03 DIAGNOSIS — E669 Obesity, unspecified: Secondary | ICD-10-CM

## 2016-06-03 NOTE — Progress Notes (Signed)
   PRENATAL VISIT NOTE  Subjective:  Madison Guzman is a 21 y.o. G1P0000 at [redacted]w[redacted]d being seen today for ongoing prenatal care.  She is currently monitored for the following issues for this low-risk pregnancy and has Depression; Asthma, moderate persistent, well-controlled; Allergic rhinitis; Vitamin D deficiency; Migraine without aura and without status migrainosus, not intractable; Tension headache; Anxiety state, unspecified; Insomnia; Tinnitus; Chronic daily headache; Tinnitus of left ear; Pseudotumor cerebri; Pseudotumor cerebri syndrome; Supervision of normal pregnancy; Nausea and vomiting during pregnancy; Chlamydia infection affecting pregnancy; Pregnancy headache, antepartum; and Obesity in pregnancy on her problem list.  Patient reports no complaints. Since last week, she has finally been able to eat again. The nause/vomitting has resolved.  Contractions: Not present. Vag. Bleeding: None.  Movement: Present. Denies leaking of fluid.   The following portions of the patient's history were reviewed and updated as appropriate: allergies, current medications, past family history, past medical history, past social history, past surgical history and problem list. Problem list updated.  Objective:   Vitals:   06/03/16 1349  BP: 114/73  Pulse: (!) 103  Weight: 225 lb (102.1 kg)    Fetal Status: Fetal Heart Rate (bpm): 157   Movement: Present     General:  Alert, oriented and cooperative. Patient is in no acute distress.  Skin: Skin is warm and dry. No rash noted.   Cardiovascular: Normal heart rate noted  Respiratory: Normal respiratory effort, no problems with respiration noted  Abdomen: Soft, gravid, appropriate for gestational age. Pain/Pressure: Absent     Pelvic:  Cervical exam deferred        Extremities: Normal range of motion.  Edema: None  Mental Status: Normal mood and affect. Normal behavior. Normal judgment and thought content.   Assessment and Plan:  Pregnancy: G1P0000 at  [redacted]w[redacted]d  1. Encounter for supervision of normal first pregnancy in second trimester - anatomy u/s this Friday  2. Obesity in pregnancy - discussed rec'd weight gain, healthy eating habits  Preterm labor symptoms and general obstetric precautions including but not limited to vaginal bleeding, contractions, leaking of fluid and fetal movement were reviewed in detail with the patient. Please refer to After Visit Summary for other counseling recommendations.  Return in about 4 weeks (around 07/01/2016).   Allie Bossier, MD

## 2016-06-06 ENCOUNTER — Ambulatory Visit (HOSPITAL_COMMUNITY)
Admission: RE | Admit: 2016-06-06 | Discharge: 2016-06-06 | Disposition: A | Discharge status: Home / Self Care | Payer: Medicaid Other | Source: Ambulatory Visit | Attending: Obstetrics & Gynecology | Admitting: Obstetrics & Gynecology

## 2016-06-06 ENCOUNTER — Other Ambulatory Visit: Payer: Self-pay | Admitting: Obstetrics & Gynecology

## 2016-06-06 DIAGNOSIS — Z3689 Encounter for other specified antenatal screening: Secondary | ICD-10-CM | POA: Diagnosis not present

## 2016-06-06 DIAGNOSIS — O99212 Obesity complicating pregnancy, second trimester: Secondary | ICD-10-CM | POA: Insufficient documentation

## 2016-06-06 DIAGNOSIS — Z3A19 19 weeks gestation of pregnancy: Secondary | ICD-10-CM | POA: Insufficient documentation

## 2016-07-01 ENCOUNTER — Ambulatory Visit (INDEPENDENT_AMBULATORY_CARE_PROVIDER_SITE_OTHER): Payer: Medicaid Other | Admitting: Obstetrics & Gynecology

## 2016-07-01 ENCOUNTER — Encounter: Payer: Self-pay | Admitting: Obstetrics & Gynecology

## 2016-07-01 VITALS — BP 117/82 | HR 118 | Wt 218.0 lb

## 2016-07-01 DIAGNOSIS — Z3402 Encounter for supervision of normal first pregnancy, second trimester: Secondary | ICD-10-CM

## 2016-07-01 NOTE — Progress Notes (Signed)
   PRENATAL VISIT NOTE  Subjective:  Madison Guzman is a 21 y.o. G1P0000 at 2737w2d being seen today for ongoing prenatal care.  She is currently monitored for the following issues for this high-risk pregnancy and has Depression; Asthma, moderate persistent, well-controlled; Allergic rhinitis; Vitamin D deficiency; Migraine without aura and without status migrainosus, not intractable; Tension headache; Anxiety state, unspecified; Insomnia; Tinnitus; Chronic daily headache; Tinnitus of left ear; Pseudotumor cerebri; Pseudotumor cerebri syndrome; Supervision of normal pregnancy; Nausea and vomiting during pregnancy; Chlamydia infection affecting pregnancy; Pregnancy headache, antepartum; and Obesity in pregnancy on her problem list.  Patient reports She is worried that her bipolar may flare up postpartum and she is willing to see a psychiatrist so that we can find a good medicaition for her to start prior to delivery. She denies vomitting but just has no appetite, only occasional nausea, has phenergan at home..  Contractions: Not present. Vag. Bleeding: None.  Movement: Present. Denies leaking of fluid.   The following portions of the patient's history were reviewed and updated as appropriate: allergies, current medications, past family history, past medical history, past social history, past surgical history and problem list. Problem list updated.  Objective:   Vitals:   07/01/16 1430  BP: 117/82  Pulse: (!) 118  Weight: 218 lb (98.9 kg)    Fetal Status: Fetal Heart Rate (bpm): 155   Movement: Present     General:  Alert, oriented and cooperative. Patient is in no acute distress.  Skin: Skin is warm and dry. No rash noted.   Cardiovascular: Normal heart rate noted  Respiratory: Normal respiratory effort, no problems with respiration noted  Abdomen: Soft, gravid, appropriate for gestational age. Pain/Pressure: Absent     Pelvic:  Cervical exam deferred        Extremities: Normal range of  motion.  Edema: None  Mental Status: Normal mood and affect. Normal behavior. Normal judgment and thought content.   Assessment and Plan:  Pregnancy: G1P0000 at 4737w2d  1. Encounter for supervision of normal first pregnancy in second trimester - MFM u/s for growth - psych for bipolar d/o - nutritionist for weight loss - 2 hour GTT at next visit along with labs and TDAP  Preterm labor symptoms and general obstetric precautions including but not limited to vaginal bleeding, contractions, leaking of fluid and fetal movement were reviewed in detail with the patient. Please refer to After Visit Summary for other counseling recommendations.  No Follow-up on file.   Allie BossierMyra C Larah Kuntzman, MD

## 2016-07-03 ENCOUNTER — Encounter: Payer: Self-pay | Admitting: Emergency Medicine

## 2016-07-03 ENCOUNTER — Emergency Department (INDEPENDENT_AMBULATORY_CARE_PROVIDER_SITE_OTHER)
Admission: EM | Admit: 2016-07-03 | Discharge: 2016-07-03 | Disposition: A | Discharge status: Home / Self Care | Payer: Medicaid Other | Source: Home / Self Care | Attending: Family Medicine | Admitting: Family Medicine

## 2016-07-03 DIAGNOSIS — J029 Acute pharyngitis, unspecified: Secondary | ICD-10-CM

## 2016-07-03 DIAGNOSIS — H9201 Otalgia, right ear: Secondary | ICD-10-CM

## 2016-07-03 DIAGNOSIS — Z20818 Contact with and (suspected) exposure to other bacterial communicable diseases: Secondary | ICD-10-CM

## 2016-07-03 LAB — POCT RAPID STREP A (OFFICE): Rapid Strep A Screen: NEGATIVE

## 2016-07-03 NOTE — ED Provider Notes (Signed)
CSN: [id number]     Arrival date & time 07/03/16  1827 History   First MD Initiated Contact with Patient 07/03/16 1855     Chief Complaint  Patient presents with  . Sore Throat   (Consider location/radiation/quality/duration/timing/severity/associated sxs/prior Treatment) HPI  Madison Guzman is a 21 y.o. female presenting to UC with c/o mild sore throat for about 2-3 days with fatigue, Right ear pain, mild non-productive cough and fatigue.  She is [redacted] weeks pregnant. She notes her younger brother was dx with strep throat last week. Denies fever, chills, vomiting or diarrhea but has had mild nausea. She is unsure if this nausea is new or from her pregnancy.     Past Medical History:  Diagnosis Date  . Asthma   . Depression   . Migraine    Pt reports since age of 72  . Pharyngitis 01/08/2013   [facility] - strep neg   Past Surgical History:  Procedure Laterality Date  . TYMPANOSTOMY TUBE PLACEMENT Bilateral    Family History  Problem Relation Age of Onset  . Asthma Mother   . Migraines Mother   . Diabetes Maternal Grandmother   . Hyperlipidemia Maternal Grandmother   . Hypertension Maternal Grandmother   . Mental illness Maternal Grandmother   . Migraines Maternal Grandmother   . Seizures Father   . ADD / ADHD Brother        1 Younger brother has ADHD  . Mental illness Maternal Aunt        Bipolar  . ADD / ADHD Maternal Uncle    Social History  Substance Use Topics  . Smoking status: Passive Smoke Exposure - Never Smoker  . Smokeless tobacco: Never Used  . Alcohol use No   OB History    Gravida Para Term Preterm AB Living   1 0 0 0 0 0   SAB TAB Ectopic Multiple Live Births   0 0 0 0       Review of Systems  Constitutional: Negative for chills and fever.  HENT: Positive for congestion (minimal), ear pain (Right) and sore throat. Negative for trouble swallowing and voice change.   Respiratory: Positive for cough ( minimal). Negative for shortness of  breath.   Cardiovascular: Negative for chest pain and palpitations.  Gastrointestinal: Positive for nausea ( minimal). Negative for abdominal pain, diarrhea and vomiting.  Musculoskeletal: Negative for arthralgias, back pain and myalgias.  Skin: Negative for rash.    Allergies  Patient has no known allergies.  Home Medications   Prior to Admission medications   Medication Sig Start Date End Date Taking? Authorizing Provider  albuterol (PROVENTIL HFA;VENTOLIN HFA) 108 (90 BASE) MCG/ACT inhaler Inhale 2 puffs by mouth every 4 hours as needed to treat wheezing 09/19/13   Maree Erie, MD  Prenat w/o A-FeCbn-Meth-FA-DHA (PRENATE MINI) 29-0.6-0.4-350 MG CAPS Take 1 tablet by mouth daily. 05/10/15   Roe Coombs, CNM   Meds Ordered and Administered this Visit  Medications - No data to display  BP 113/72 (BP Location: Left Arm)   Pulse 92   Temp 98.4 F (36.9 C) (Oral)   Ht 5\' 4"  (1.626 m)   Wt 220 lb (99.8 kg)   LMP 12/13/2015   SpO2 99%   BMI 37.76 kg/m  No data found.   Physical Exam  Constitutional: She is oriented to person, place, and time. She appears well-developed and well-nourished. No distress.  HENT:  Head: Normocephalic and atraumatic.  Right Ear: Tympanic  membrane normal.  Left Ear: Tympanic membrane normal.  Nose: Nose normal.  Mouth/Throat: Uvula is midline, oropharynx is clear and moist and mucous membranes are normal.  Eyes: EOM are normal.  Neck: Normal range of motion. Neck supple.  Cardiovascular: Normal rate and regular rhythm.   Tachycardia in triage, regular rhythm on exam.  Pulmonary/Chest: Effort normal and breath sounds normal. No stridor. No respiratory distress. She has no wheezes. She has no rales.  Musculoskeletal: Normal range of motion.  Lymphadenopathy:    She has no cervical adenopathy.  Neurological: She is alert and oriented to person, place, and time.  Skin: Skin is warm and dry. She is not diaphoretic.  Psychiatric: She has a  normal mood and affect. Her behavior is normal.  Nursing note and vitals reviewed.   Urgent Care Course     Procedures (including critical care time)  Labs Review Labs Reviewed  STREP A DNA PROBE  POCT RAPID STREP A (OFFICE)    Imaging Review No results found.    MDM   1. Pharyngitis, unspecified etiology   2. Sore throat   3. Exposure to strep throat   4. Right ear pain    Exam unremarkable, however, due to known exposure, will send strep culture.  Encouraged symptomatic treatment with acetaminophen, fluids, and rest. F/u with PCP next week as needed.    Junius FinnerO'Malley, Shakerria Parran, PA-C 07/03/16 1911

## 2016-07-03 NOTE — ED Triage Notes (Signed)
Sore throat, fatigue,started today,  exposed to strep last week

## 2016-07-04 ENCOUNTER — Telehealth: Payer: Self-pay | Admitting: Emergency Medicine

## 2016-07-04 LAB — STREP A DNA PROBE: GASP: NOT DETECTED

## 2016-07-04 NOTE — Telephone Encounter (Signed)
Strep culture negative, salt water gargles, throat lozenges, rest. Hope she improves soon.

## 2016-07-14 ENCOUNTER — Other Ambulatory Visit: Payer: Self-pay | Admitting: Obstetrics & Gynecology

## 2016-07-14 ENCOUNTER — Ambulatory Visit (HOSPITAL_COMMUNITY)
Admission: RE | Admit: 2016-07-14 | Discharge: 2016-07-14 | Disposition: A | Discharge status: Home / Self Care | Payer: Medicaid Other | Source: Ambulatory Visit | Attending: Obstetrics & Gynecology | Admitting: Obstetrics & Gynecology

## 2016-07-14 DIAGNOSIS — Z362 Encounter for other antenatal screening follow-up: Secondary | ICD-10-CM | POA: Insufficient documentation

## 2016-07-14 DIAGNOSIS — O99211 Obesity complicating pregnancy, first trimester: Secondary | ICD-10-CM | POA: Diagnosis not present

## 2016-07-14 DIAGNOSIS — Z3402 Encounter for supervision of normal first pregnancy, second trimester: Secondary | ICD-10-CM | POA: Diagnosis present

## 2016-07-14 DIAGNOSIS — Z3A25 25 weeks gestation of pregnancy: Secondary | ICD-10-CM | POA: Diagnosis not present

## 2016-07-14 DIAGNOSIS — O365920 Maternal care for other known or suspected poor fetal growth, second trimester, not applicable or unspecified: Secondary | ICD-10-CM | POA: Diagnosis not present

## 2016-07-28 ENCOUNTER — Ambulatory Visit (INDEPENDENT_AMBULATORY_CARE_PROVIDER_SITE_OTHER): Payer: Medicaid Other | Admitting: Obstetrics & Gynecology

## 2016-07-28 VITALS — BP 125/75 | HR 98 | Wt 217.0 lb

## 2016-07-28 DIAGNOSIS — Z23 Encounter for immunization: Secondary | ICD-10-CM | POA: Diagnosis not present

## 2016-07-28 DIAGNOSIS — Z3493 Encounter for supervision of normal pregnancy, unspecified, third trimester: Secondary | ICD-10-CM

## 2016-07-28 DIAGNOSIS — R634 Abnormal weight loss: Secondary | ICD-10-CM

## 2016-07-28 LAB — CBC
HCT: 37.3 % (ref 35.0–45.0)
Hemoglobin: 12.5 g/dL (ref 11.7–15.5)
MCH: 28.8 pg (ref 27.0–33.0)
MCHC: 33.5 g/dL (ref 32.0–36.0)
MCV: 85.9 fL (ref 80.0–100.0)
MPV: 9.3 fL (ref 7.5–12.5)
Platelets: 340 10*3/uL (ref 140–400)
RBC: 4.34 MIL/uL (ref 3.80–5.10)
RDW: 14.7 % (ref 11.0–15.0)
WBC: 12.2 10*3/uL — ABNORMAL HIGH (ref 3.8–10.8)

## 2016-07-28 NOTE — Progress Notes (Signed)
   PRENATAL VISIT NOTE  Subjective:  Madison Guzman is a 21 y.o. G1P0000 at 2167w1d being seen today for ongoing prenatal care.  She is currently monitored for the following issues for this low-risk pregnancy and has Depression; Asthma, moderate persistent, well-controlled; Allergic rhinitis; Vitamin D deficiency; Migraine without aura and without status migrainosus, not intractable; Tension headache; Anxiety state, unspecified; Insomnia; Tinnitus; Chronic daily headache; Tinnitus of left ear; Pseudotumor cerebri; Pseudotumor cerebri syndrome; Supervision of normal pregnancy; Nausea and vomiting during pregnancy; Chlamydia infection affecting pregnancy; Pregnancy headache, antepartum; and Obesity in pregnancy on her problem list.  Patient reports no complaints.  Contractions: Not present. Vag. Bleeding: None.  Movement: Present. Denies leaking of fluid.   The following portions of the patient's history were reviewed and updated as appropriate: allergies, current medications, past family history, past medical history, past social history, past surgical history and problem list. Problem list updated.  Objective:   Vitals:   07/28/16 0827  BP: 125/75  Pulse: 98  Weight: 217 lb (98.4 kg)    Fetal Status:     Movement: Present     General:  Alert, oriented and cooperative. Patient is in no acute distress.  Skin: Skin is warm and dry. No rash noted.   Cardiovascular: Normal heart rate noted  Respiratory: Normal respiratory effort, no problems with respiration noted  Abdomen: Soft, gravid, appropriate for gestational age. Pain/Pressure: Absent     Pelvic:  Cervical exam deferred        Extremities: Normal range of motion.  Edema: None  Mental Status: Normal mood and affect. Normal behavior. Normal judgment and thought content.   Assessment and Plan:  Pregnancy: G1P0000 at 1067w1d  1. Encounter for supervision of normal pregnancy in third trimester, unspecified gravidity  - CBC - HIV antibody  (with reflex) - RPR - Tdap vaccine greater than or equal to 7yo IM - 2Hr GTT w/ 1 Hr Carpenter 75 g - US MFM OB FOLLOW UP; Future 2. Weight loss- refer to nutritionist Preterm labor symptoms and general obstetric precautions including but not limited to vaginal bleeding, contractions, leaking of fluid and fetal movement were reviewed in detail with the patient. Please refer to After Visit Summary for other counseling recommendations.  Return in about 3 weeks (around 08/18/2016).   Allie BossierMyra C Chloey Ricard, MD

## 2016-07-29 LAB — RPR

## 2016-07-29 LAB — HIV ANTIBODY (ROUTINE TESTING W REFLEX): HIV 1&2 Ab, 4th Generation: NONREACTIVE

## 2016-07-30 ENCOUNTER — Ambulatory Visit: Payer: Medicaid Other | Admitting: *Deleted

## 2016-08-01 ENCOUNTER — Other Ambulatory Visit (INDEPENDENT_AMBULATORY_CARE_PROVIDER_SITE_OTHER): Payer: Medicaid Other

## 2016-08-01 DIAGNOSIS — Z3493 Encounter for supervision of normal pregnancy, unspecified, third trimester: Secondary | ICD-10-CM | POA: Diagnosis not present

## 2016-08-02 LAB — GLUCOSE TOLERANCE, 1 HOUR (50G) W/O FASTING: Glucose, 1 Hr, gestational: 88 mg/dL (ref <140)

## 2016-08-04 ENCOUNTER — Other Ambulatory Visit: Payer: Medicaid Other

## 2016-08-11 ENCOUNTER — Ambulatory Visit (INDEPENDENT_AMBULATORY_CARE_PROVIDER_SITE_OTHER): Payer: Medicaid Other | Admitting: Obstetrics & Gynecology

## 2016-08-11 VITALS — BP 115/73 | HR 92 | Wt 217.0 lb

## 2016-08-11 DIAGNOSIS — O99210 Obesity complicating pregnancy, unspecified trimester: Secondary | ICD-10-CM

## 2016-08-11 DIAGNOSIS — O99213 Obesity complicating pregnancy, third trimester: Secondary | ICD-10-CM

## 2016-08-11 DIAGNOSIS — E669 Obesity, unspecified: Secondary | ICD-10-CM

## 2016-08-11 DIAGNOSIS — Z3403 Encounter for supervision of normal first pregnancy, third trimester: Secondary | ICD-10-CM

## 2016-08-11 NOTE — Progress Notes (Signed)
   PRENATAL VISIT NOTE  Subjective:  Madison Guzman is a 21 y.o. G1P0000 at 624w1d being seen today for ongoing prenatal care.  She is currently monitored for the following issues for this low-risk pregnancy and has Depression; Asthma, moderate persistent, well-controlled; Allergic rhinitis; Vitamin D deficiency; Migraine without aura and without status migrainosus, not intractable; Tension headache; Anxiety state, unspecified; Insomnia; Tinnitus; Chronic daily headache; Tinnitus of left ear; Pseudotumor cerebri; Pseudotumor cerebri syndrome; Supervision of normal pregnancy; Nausea and vomiting during pregnancy; Chlamydia infection affecting pregnancy; Pregnancy headache, antepartum; and Obesity in pregnancy on her problem list.  Patient reports no complaints.  Contractions: Irritability. Vag. Bleeding: None.  Movement: Present. Denies leaking of fluid.   The following portions of the patient's history were reviewed and updated as appropriate: allergies, current medications, past family history, past medical history, past social history, past surgical history and problem list. Problem list updated.  Objective:   Vitals:   08/11/16 1327  BP: 115/73  Pulse: 92  Weight: 217 lb (98.4 kg)    Fetal Status: Fetal Heart Rate (bpm): 150   Movement: Present     General:  Alert, oriented and cooperative. Patient is in no acute distress.  Skin: Skin is warm and dry. No rash noted.   Cardiovascular: Normal heart rate noted  Respiratory: Normal respiratory effort, no problems with respiration noted  Abdomen: Soft, gravid, appropriate for gestational age. Pain/Pressure: Present     Pelvic:  Cervical exam deferred        Extremities: Normal range of motion.  Edema: None  Mental Status: Normal mood and affect. Normal behavior. Normal judgment and thought content.   Assessment and Plan:  Pregnancy: G1P0000 at 184w1d  1. Obesity in pregnancy - She has an appt with a nutritionist next Wed  2. Encounter  for supervision of normal first pregnancy in third trimester - MFM u/s this Friday  Preterm labor symptoms and general obstetric precautions including but not limited to vaginal bleeding, contractions, leaking of fluid and fetal movement were reviewed in detail with the patient. Please refer to After Visit Summary for other counseling recommendations.  No Follow-up on file.   Allie BossierMyra C Nahla Lukin, MD

## 2016-08-15 ENCOUNTER — Other Ambulatory Visit: Payer: Self-pay | Admitting: Obstetrics & Gynecology

## 2016-08-15 ENCOUNTER — Ambulatory Visit (HOSPITAL_COMMUNITY)
Admission: RE | Admit: 2016-08-15 | Discharge: 2016-08-15 | Disposition: A | Discharge status: Home / Self Care | Payer: Medicaid Other | Source: Ambulatory Visit | Attending: Obstetrics & Gynecology | Admitting: Obstetrics & Gynecology

## 2016-08-15 DIAGNOSIS — O365930 Maternal care for other known or suspected poor fetal growth, third trimester, not applicable or unspecified: Secondary | ICD-10-CM | POA: Diagnosis not present

## 2016-08-15 DIAGNOSIS — Z362 Encounter for other antenatal screening follow-up: Secondary | ICD-10-CM

## 2016-08-15 DIAGNOSIS — Z3493 Encounter for supervision of normal pregnancy, unspecified, third trimester: Secondary | ICD-10-CM

## 2016-08-15 DIAGNOSIS — Z3A29 29 weeks gestation of pregnancy: Secondary | ICD-10-CM | POA: Diagnosis not present

## 2016-08-19 ENCOUNTER — Encounter (HOSPITAL_COMMUNITY): Payer: Self-pay | Admitting: Psychiatry

## 2016-08-19 ENCOUNTER — Ambulatory Visit (INDEPENDENT_AMBULATORY_CARE_PROVIDER_SITE_OTHER): Payer: Medicaid Other | Admitting: Psychiatry

## 2016-08-19 VITALS — BP 124/76 | HR 89 | Resp 16 | Ht 64.0 in | Wt 216.0 lb

## 2016-08-19 DIAGNOSIS — O26893 Other specified pregnancy related conditions, third trimester: Secondary | ICD-10-CM

## 2016-08-19 DIAGNOSIS — Z818 Family history of other mental and behavioral disorders: Secondary | ICD-10-CM

## 2016-08-19 DIAGNOSIS — Z3A30 30 weeks gestation of pregnancy: Secondary | ICD-10-CM

## 2016-08-19 DIAGNOSIS — F411 Generalized anxiety disorder: Secondary | ICD-10-CM | POA: Diagnosis not present
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
## 2016-08-19 NOTE — Patient Instructions (Signed)
Schedule for therapy

## 2016-08-20 ENCOUNTER — Encounter: Payer: Self-pay | Admitting: *Deleted

## 2016-08-20 ENCOUNTER — Encounter: Payer: Medicaid Other | Attending: Obstetrics & Gynecology | Admitting: *Deleted

## 2016-08-20 DIAGNOSIS — O26899 Other specified pregnancy related conditions, unspecified trimester: Secondary | ICD-10-CM | POA: Insufficient documentation

## 2016-08-20 DIAGNOSIS — O365990 Maternal care for other known or suspected poor fetal growth, unspecified trimester, not applicable or unspecified: Secondary | ICD-10-CM

## 2016-08-20 DIAGNOSIS — R634 Abnormal weight loss: Secondary | ICD-10-CM | POA: Diagnosis present

## 2016-08-20 NOTE — Progress Notes (Signed)
Medical Nutrition Therapy:  Appt start time: 1415 end time:  1500.   Assessment:  Primary concerns today:  Has lost 24 pounds since conception.  Baby isn't growing like it should.  Did not have an appetite and was sick in early stages of pregancy.  She states she is eating now, but not gaining Is no longer throwing up.  Get nauseated if she doesn't eat, but no other issues.   Was gaining weight rapidly prior to conception.    Does her own grocery shopping and food prep.  She typically bakes.  Eats out twice/week When at home, she eats in the living.  She eats while distracted.  She can be a slow eater.   She is picky.  Has always been picky, but that is even worse with pregnancy.  Craves vegetables and doesn't like condiments or sweets.   Father of the baby is very slender.    Preferred Learning Style:   No preference indicated   Learning Readiness:   Ready   MEDICATIONS: PNV   DIETARY INTAKE:  Usual eating pattern includes 4 meals and 3-4 snacks per day.  Avoided foods include pizza, cake icing, strawberries, cantlope watermelon, pineapples (possible new allergy), bread (taste different), some other things if they don't smell or taste right  24-hr recall:  B ( AM): bacon, eggs, cheese biscuit, OJ and hashbrown  Snk ( AM): can't remember  L ( PM): can't remember Snk ( PM): chicken thighs Frozen broccoli D ( PM): chicken tenders, mashed pot, salad Snk ( PM): can't remember Beverages: water, unsweet tea, OJ  Usual physical activity: was doing yoga, but might not be able to continue  Estimated energy needs: 2800-3200 calories    Nutritional Diagnosis:  [place]-3.2 Unintentional weight loss As related to unknown etiology.  As evidenced by 24 pound wieght loss since conception.    Intervention:  Nutrition counseling provided. Unclear if there could be an underlying medical reason for weight loss.  Assuming increased calories will help  Goals: 3 meals and 3 snacks every  day Each meal needs starch, protein, and fat as well as chocolate milk Each snack needs protein or fat with carbohydrate Use Carnation Breakfast Essentials as snack or meal supplements if you can't finish meal Use sprite or juice with snacks for additional calories  Gave hanadout on increasing calories  Teaching Method Utilized:  Visual Auditory   Handouts given during visit include:  Client ed for high calorie, high protein diet  Barriers to learning/adherence to lifestyle change: pickiness  Demonstrated degree of understanding via:  Teach Back   Monitoring/Evaluation:  Dietary intake, exercise, and body weight prn.

## 2016-08-22 ENCOUNTER — Inpatient Hospital Stay (HOSPITAL_COMMUNITY)
Admission: AD | Admit: 2016-08-22 | Discharge: 2016-08-23 | Disposition: A | Discharge status: Home / Self Care | Payer: Medicaid Other | Source: Ambulatory Visit | Attending: Obstetrics & Gynecology | Admitting: Obstetrics & Gynecology

## 2016-08-22 ENCOUNTER — Encounter (HOSPITAL_COMMUNITY): Payer: Self-pay

## 2016-08-22 DIAGNOSIS — Z3A30 30 weeks gestation of pregnancy: Secondary | ICD-10-CM | POA: Insufficient documentation

## 2016-08-22 DIAGNOSIS — Z7722 Contact with and (suspected) exposure to environmental tobacco smoke (acute) (chronic): Secondary | ICD-10-CM | POA: Diagnosis not present

## 2016-08-22 DIAGNOSIS — O9989 Other specified diseases and conditions complicating pregnancy, childbirth and the puerperium: Secondary | ICD-10-CM | POA: Diagnosis not present

## 2016-08-22 DIAGNOSIS — O26893 Other specified pregnancy related conditions, third trimester: Secondary | ICD-10-CM | POA: Diagnosis not present

## 2016-08-22 DIAGNOSIS — R51 Headache: Secondary | ICD-10-CM | POA: Diagnosis present

## 2016-08-22 DIAGNOSIS — R030 Elevated blood-pressure reading, without diagnosis of hypertension: Secondary | ICD-10-CM | POA: Insufficient documentation

## 2016-08-22 DIAGNOSIS — Z3689 Encounter for other specified antenatal screening: Secondary | ICD-10-CM

## 2016-08-22 LAB — URINALYSIS, ROUTINE W REFLEX MICROSCOPIC
Bilirubin Urine: NEGATIVE
Glucose, UA: NEGATIVE mg/dL
Hgb urine dipstick: NEGATIVE
Ketones, ur: NEGATIVE mg/dL
Nitrite: NEGATIVE
Protein, ur: NEGATIVE mg/dL
Specific Gravity, Urine: 1.018 (ref 1.005–1.030)
pH: 6 (ref 5.0–8.0)

## 2016-08-22 LAB — COMPREHENSIVE METABOLIC PANEL
ALT: 10 U/L — ABNORMAL LOW (ref 14–54)
AST: 12 U/L — ABNORMAL LOW (ref 15–41)
Albumin: 3.2 g/dL — ABNORMAL LOW (ref 3.5–5.0)
Alkaline Phosphatase: 97 U/L (ref 38–126)
Anion gap: 7 (ref 5–15)
BUN: 5 mg/dL — ABNORMAL LOW (ref 6–20)
CO2: 20 mmol/L — ABNORMAL LOW (ref 22–32)
Calcium: 9.1 mg/dL (ref 8.9–10.3)
Chloride: 106 mmol/L (ref 101–111)
Creatinine, Ser: 0.45 mg/dL (ref 0.44–1.00)
GFR calc Af Amer: >60 mL/min (ref >60)
GFR calc non Af Amer: >60 mL/min (ref >60)
Glucose, Bld: 86 mg/dL (ref 65–99)
Potassium: 4 mmol/L (ref 3.5–5.1)
Sodium: 133 mmol/L — ABNORMAL LOW (ref 135–145)
Total Bilirubin: 0.3 mg/dL (ref 0.3–1.2)
Total Protein: 6.6 g/dL (ref 6.5–8.1)

## 2016-08-22 LAB — CBC
HCT: 34.6 % — ABNORMAL LOW (ref 36.0–46.0)
Hemoglobin: 12 g/dL (ref 12.0–15.0)
MCH: 28.8 pg (ref 26.0–34.0)
MCHC: 34.7 g/dL (ref 30.0–36.0)
MCV: 83 fL (ref 78.0–100.0)
Platelets: 323 10*3/uL (ref 150–400)
RBC: 4.17 MIL/uL (ref 3.87–5.11)
RDW: 14.2 % (ref 11.5–15.5)
WBC: 13.6 10*3/uL — ABNORMAL HIGH (ref 4.0–10.5)

## 2016-08-22 LAB — PROTEIN / CREATININE RATIO, URINE
Creatinine, Urine: 162 mg/dL
Protein Creatinine Ratio: 0.1 mg/mg{Cre} (ref 0.00–0.15)
Total Protein, Urine: 17 mg/dL

## 2016-08-22 MED ORDER — ACETAMINOPHEN 500 MG PO TABS
500.0000 mg | ORAL_TABLET | Freq: Once | ORAL | Status: AC
  Administered 2016-08-22: 500 mg via ORAL
  Filled 2016-08-22: qty 1

## 2016-08-22 MED ORDER — CYCLOBENZAPRINE HCL 10 MG PO TABS
10.0000 mg | ORAL_TABLET | Freq: Once | ORAL | Status: AC
  Administered 2016-08-22: 10 mg via ORAL
  Filled 2016-08-22: qty 1

## 2016-08-22 NOTE — MAU Provider Note (Signed)
History     CSN: [id number]  Arrival date and time: 08/22/16 2132  First Provider Initiated Contact with Patient 08/22/16 2250   Chief Complaint  Patient presents with  . Hypertension  . Headache   HPI Madison Guzman is a 21 y.o. G1P0000 at [redacted]w[redacted]d who presents with hypertension & headache. Denies history of hypertension. Took BP at home because she felt strange. BP was 176/132, 150/76, & 161/100. States she has used this cuff before without elevated BPs. Endorses headache since this morning. Patient with history of migraines & states this feels like her normal headaches. Rates pain 6/10 which is down from 9/10 prior to taking 1 ES tylenol at 8 pm. Associated symptoms include blurred vision for 3 days which is normal for her d/t migraines & pseudotumor cerebri. Denies CP, SOB, epigastric pain. Positive fetal movement.   OB History    Gravida Para Term Preterm AB Living   1 0 0 0 0 0   SAB TAB Ectopic Multiple Live Births   0 0 0 0        Past Medical History:  Diagnosis Date  . Asthma   . Depression   . Migraine    Pt reports since age of 41  . Pharyngitis 01/08/2013   [facility] - strep neg    Past Surgical History:  Procedure Laterality Date  . TYMPANOSTOMY TUBE PLACEMENT Bilateral     Family History  Problem Relation Age of Onset  . Asthma Mother   . Migraines Mother   . Diabetes Maternal Grandmother   . Hyperlipidemia Maternal Grandmother   . Hypertension Maternal Grandmother   . Mental illness Maternal Grandmother   . Migraines Maternal Grandmother   . Seizures Father   . ADD / ADHD Brother        1 Younger brother has ADHD  . Mental illness Maternal Aunt        Bipolar  . ADD / ADHD Maternal Uncle     Social History  Substance Use Topics  . Smoking status: Passive Smoke Exposure - Never Smoker  . Smokeless tobacco: Never Used  . Alcohol use No    Allergies: No Known Allergies  Prescriptions Prior to Admission  Medication Sig Dispense Refill  Last Dose  . Prenat w/o A-FeCbn-Meth-FA-DHA (PRENATE MINI) 29-0.6-0.4-350 MG CAPS Take 1 tablet by mouth daily. 30 capsule 12 Past Week at Unknown time  . albuterol (PROVENTIL HFA;VENTOLIN HFA) 108 (90 BASE) MCG/ACT inhaler Inhale 2 puffs by mouth every 4 hours as needed to treat wheezing (Patient not taking: Reported on 08/19/2016) 2 Inhaler 1 Not Taking    Review of Systems  Constitutional: Negative.   Eyes: Positive for visual disturbance.  Respiratory: Negative.   Cardiovascular: Negative.   Gastrointestinal: Negative.   Genitourinary: Negative.   Neurological: Positive for light-headedness and headaches. Negative for dizziness and syncope.   Physical Exam   Blood pressure 130/63, pulse 97, temperature 98.1 F (36.7 C), temperature source Oral, resp. rate 18, height 5\' 4"  (1.626 m), weight 219 lb (99.3 kg), last menstrual period 12/13/2015.  Physical Exam  Nursing note and vitals reviewed. Constitutional: She is oriented to person, place, and time. She appears well-developed and well-nourished. No distress.  HENT:  Head: Normocephalic and atraumatic.  Eyes: Conjunctivae are normal. Right eye exhibits no discharge. Left eye exhibits no discharge. No scleral icterus.  Neck: Normal range of motion.  Cardiovascular: Normal rate, regular rhythm and normal heart sounds.   No murmur heard. Respiratory:  Effort normal and breath sounds normal. No respiratory distress. She has no wheezes.  GI: Soft. Bowel sounds are normal. There is no tenderness.  Musculoskeletal: She exhibits no edema.  Neurological: She is alert and oriented to person, place, and time. She has normal reflexes.  No clonus  Skin: Skin is warm and dry. She is not diaphoretic.  Psychiatric: She has a normal mood and affect. Her behavior is normal. Judgment and thought content normal.   Fetal Tracing:  Baseline: 145 Variability: moderate Accelerations: 15x15 Decelerations: none  Toco: none MAU Course   Procedures Results for orders placed or performed during the hospital encounter of 08/22/16 (from the past 24 hour(s))  Urinalysis, Routine w reflex microscopic     Status: Abnormal   Collection Time: 08/22/16 10:00 PM  Result Value Ref Range   Color, Urine YELLOW YELLOW   APPearance HAZY (A) CLEAR   Specific Gravity, Urine 1.018 1.005 - 1.030   pH 6.0 5.0 - 8.0   Glucose, UA NEGATIVE NEGATIVE mg/dL   Hgb urine dipstick NEGATIVE NEGATIVE   Bilirubin Urine NEGATIVE NEGATIVE   Ketones, ur NEGATIVE NEGATIVE mg/dL   Protein, ur NEGATIVE NEGATIVE mg/dL   Nitrite NEGATIVE NEGATIVE   Leukocytes, UA SMALL (A) NEGATIVE   RBC / HPF 0-5 0 - 5 RBC/hpf   WBC, UA 6-30 0 - 5 WBC/hpf   Bacteria, UA MANY (A) NONE SEEN   Squamous Epithelial / LPF 6-30 (A) NONE SEEN   Mucous PRESENT    Ca Oxalate Crys, UA PRESENT   Protein / creatinine ratio, urine     Status: None   Collection Time: 08/22/16 10:00 PM  Result Value Ref Range   Creatinine, Urine 162.00 mg/dL   Total Protein, Urine 17 mg/dL   Protein Creatinine Ratio 0.10 0.00 - 0.15 mg/mg[Cre]  CBC     Status: Abnormal   Collection Time: 08/22/16 11:10 PM  Result Value Ref Range   WBC 13.6 (H) 4.0 - 10.5 K/uL   RBC 4.17 3.87 - 5.11 MIL/uL   Hemoglobin 12.0 12.0 - 15.0 g/dL   HCT 40.9 (L) 81.1 - 91.4 %   MCV 83.0 78.0 - 100.0 fL   MCH 28.8 26.0 - 34.0 pg   MCHC 34.7 30.0 - 36.0 g/dL   RDW 78.2 95.6 - 21.3 %   Platelets 323 150 - 400 K/uL  Comprehensive metabolic panel     Status: Abnormal   Collection Time: 08/22/16 11:10 PM  Result Value Ref Range   Sodium 133 (L) 135 - 145 mmol/L   Potassium 4.0 3.5 - 5.1 mmol/L   Chloride 106 101 - 111 mmol/L   CO2 20 (L) 22 - 32 mmol/L   Glucose, Bld 86 65 - 99 mg/dL   BUN 5 (L) 6 - 20 mg/dL   Creatinine, Ser 0.86 0.44 - 1.00 mg/dL   Calcium 9.1 8.9 - 57.8 mg/dL   Total Protein 6.6 6.5 - 8.1 g/dL   Albumin 3.2 (L) 3.5 - 5.0 g/dL   AST 12 (L) 15 - 41 U/L   ALT 10 (L) 14 - 54 U/L   Alkaline  Phosphatase 97 38 - 126 U/L   Total Bilirubin 0.3 0.3 - 1.2 mg/dL   GFR calc non Af Amer >60 >60 mL/min   GFR calc Af Amer >60 >60 mL/min   Anion gap 7 5 - 15    MDM Reactive NST Pt normotensive in MAU but will obtain PIH labs d/t patient's elevated BPs at home &  headache.  Tylenol 500 mg & flexeril 10 mg Pt remains normotensive in MAU & PIH labs negative Headache improved 6>3/10 Assessment and Plan  A; 1. Elevated BP without diagnosis of hypertension   2. NST (non-stress test) reactive   3. Headache in pregnancy, antepartum, third trimester    P; Discharge home Discussed reasons to return to MAU including s/s preeclampsia Patient has f/u appt in office on Monday -- instructed pt to take BP cuff with her to ensure accuracy  Judeth Hornrin Dontrell Stuck 08/22/2016, 10:49 PM

## 2016-08-22 NOTE — MAU Note (Signed)
Tired and uncomfortable since yesterday. Today felt lightheaded like I may pass out. Took my b/p and was high at home several times. Called and told to come in. Have cramping in lower abd on L side all day but it happens a lot and was not worried about that. Denies LOF or bleeding

## 2016-08-23 DIAGNOSIS — R030 Elevated blood-pressure reading, without diagnosis of hypertension: Secondary | ICD-10-CM

## 2016-08-23 DIAGNOSIS — O9989 Other specified diseases and conditions complicating pregnancy, childbirth and the puerperium: Secondary | ICD-10-CM | POA: Diagnosis not present

## 2016-08-23 NOTE — Discharge Instructions (Signed)
Take BP cuff with you to your next office visit on Monday      Hypertension During Pregnancy Hypertension, commonly called high blood pressure, is when the force of blood pumping through your arteries is too strong. Arteries are blood vessels that carry blood from the heart throughout the body. Hypertension during pregnancy can cause problems for you and your baby. Your baby may be born early (prematurely) or may not weigh as much as he or she should at birth. Very bad cases of hypertension during pregnancy can be life-threatening. Different types of hypertension can occur during pregnancy. These include:  Chronic hypertension. This happens when: ? You have hypertension before pregnancy and it continues during pregnancy. ? You develop hypertension before you are [redacted] weeks pregnant, and it continues during pregnancy.  Gestational hypertension. This is hypertension that develops after the 20th week of pregnancy.  Preeclampsia, also called toxemia of pregnancy. This is a very serious type of hypertension that develops only during pregnancy. It affects the whole body, and it can be very dangerous for you and your baby.  Gestational hypertension and preeclampsia usually go away within 6 weeks after your baby is born. Women who have hypertension during pregnancy have a greater chance of developing hypertension later in life or during future pregnancies. What are the causes? The exact cause of hypertension is not known. What increases the risk? There are certain factors that make it more likely for you to develop hypertension during pregnancy. These include:  Having hypertension during a previous pregnancy or prior to pregnancy.  Being overweight.  Being older than age 20.  Being pregnant for the first time or being pregnant with more than one baby.  Becoming pregnant using fertilization methods such as IVF (in vitro fertilization).  Having diabetes, kidney problems, or systemic lupus  erythematosus.  Having a family history of hypertension.  What are the signs or symptoms? Chronic hypertension and gestational hypertension rarely cause symptoms. Preeclampsia causes symptoms, which may include:  Increased protein in your urine. Your health care provider will check for this at every visit before you give birth (prenatal visit).  Severe headaches.  Sudden weight gain.  Swelling of the hands, face, legs, and feet.  Nausea and vomiting.  Vision problems, such as blurred or double vision.  Numbness in the face, arms, legs, and feet.  Dizziness.  Slurred speech.  Sensitivity to bright lights.  Abdominal pain.  Convulsions.  How is this diagnosed? You may be diagnosed with hypertension during a routine prenatal exam. At each prenatal visit, you may:  Have a urine test to check for high amounts of protein in your urine.  Have your blood pressure checked. A blood pressure reading is recorded as two numbers, such as "120 over 80" (or 120/80). The first ("top") number is called the systolic pressure. It is a measure of the pressure in your arteries when your heart beats. The second ("bottom") number is called the diastolic pressure. It is a measure of the pressure in your arteries as your heart relaxes between beats. Blood pressure is measured in a unit called mm Hg. A normal blood pressure reading is: ? Systolic: below 120. ? Diastolic: below 80.  The type of hypertension that you are diagnosed with depends on your test results and when your symptoms developed.  Chronic hypertension is usually diagnosed before 20 weeks of pregnancy.  Gestational hypertension is usually diagnosed after 20 weeks of pregnancy.  Hypertension with high amounts of protein in the urine is  diagnosed as preeclampsia.  Blood pressure measurements that stay above 160 systolic, or above 110 diastolic, are signs of severe preeclampsia.  How is this treated? Treatment for hypertension  during pregnancy varies depending on the type of hypertension you have and how serious it is.  If you take medicines called ACE inhibitors to treat chronic hypertension, you may need to switch medicines. ACE inhibitors should not be taken during pregnancy.  If you have gestational hypertension, you may need to take blood pressure medicine.  If you are at risk for preeclampsia, your health care provider may recommend that you take a low-dose aspirin every day to prevent high blood pressure during your pregnancy.  If you have severe preeclampsia, you may need to be hospitalized so you and your baby can be monitored closely. You may also need to take medicine (magnesium sulfate) to prevent seizures and to lower blood pressure. This medicine may be given as an injection or through an IV tube.  In some cases, if your condition gets worse, you may need to deliver your baby early.  Follow these instructions at home: Eating and drinking  Drink enough fluid to keep your urine clear or pale yellow.  Eat a healthy diet that is low in salt (sodium). Do not add salt to your food. Check food labels to see how much sodium a food or beverage contains. Lifestyle  Do not use any products that contain nicotine or tobacco, such as cigarettes and e-cigarettes. If you need help quitting, ask your health care provider.  Do not use alcohol.  Avoid caffeine.  Avoid stress as much as possible. Rest and get plenty of sleep. General instructions  Take over-the-counter and prescription medicines only as told by your health care provider.  While lying down, lie on your left side. This keeps pressure off your baby.  While sitting or lying down, raise (elevate) your feet. Try putting some pillows under your lower legs.  Exercise regularly. Ask your health care provider what kinds of exercise are best for you.  Keep all prenatal and follow-up visits as told by your health care provider. This is  important. Contact a health care provider if:  You have symptoms that your health care provider told you may require more treatment or monitoring, such as: ? Fever. ? Vomiting. ? Headache. Get help right away if:  You have severe abdominal pain or vomiting that does not get better with treatment.  You suddenly develop swelling in your hands, ankles, or face.  You gain 4 lbs (1.8 kg) or more in 1 week.  You develop vaginal bleeding, or you have blood in your urine.  You do not feel your baby moving as much as usual.  You have blurred or double vision.  You have muscle twitching or sudden tightening (spasms).  You have shortness of breath.  Your lips or fingernails turn blue. This information is not intended to replace advice given to you by your health care provider. Make sure you discuss any questions you have with your health care provider. Document Released: 10/15/2010 Document Revised: 08/17/2015 Document Reviewed: 07/13/2015 Elsevier Interactive Patient Education  [employer].

## 2016-08-25 ENCOUNTER — Ambulatory Visit (INDEPENDENT_AMBULATORY_CARE_PROVIDER_SITE_OTHER): Payer: Medicaid Other | Admitting: Obstetrics & Gynecology

## 2016-08-25 VITALS — BP 105/70 | HR 104 | Wt 221.0 lb

## 2016-08-25 DIAGNOSIS — O99210 Obesity complicating pregnancy, unspecified trimester: Secondary | ICD-10-CM

## 2016-08-25 DIAGNOSIS — E669 Obesity, unspecified: Secondary | ICD-10-CM

## 2016-08-25 DIAGNOSIS — A749 Chlamydial infection, unspecified: Secondary | ICD-10-CM

## 2016-08-25 DIAGNOSIS — O98813 Other maternal infectious and parasitic diseases complicating pregnancy, third trimester: Secondary | ICD-10-CM

## 2016-08-25 DIAGNOSIS — O99213 Obesity complicating pregnancy, third trimester: Secondary | ICD-10-CM

## 2016-08-25 DIAGNOSIS — Z3403 Encounter for supervision of normal first pregnancy, third trimester: Secondary | ICD-10-CM

## 2016-08-25 DIAGNOSIS — G932 Benign intracranial hypertension: Secondary | ICD-10-CM

## 2016-08-25 NOTE — Progress Notes (Signed)
   PRENATAL VISIT NOTE  Subjective:  Madison Guzman is a 21 y.o. G1P0000 at 7980w1d being seen today for ongoing prenatal care.  She is currently monitored for the following issues for this high-risk pregnancy and has Depression; Asthma, moderate persistent, well-controlled; Allergic rhinitis; Vitamin D deficiency; Migraine without aura and without status migrainosus, not intractable; Tension headache; Anxiety state, unspecified; Insomnia; Tinnitus; Chronic daily headache; Tinnitus of left ear; Pseudotumor cerebri; Pseudotumor cerebri syndrome; Supervision of normal pregnancy; Nausea and vomiting during pregnancy; Chlamydia infection affecting pregnancy; Pregnancy headache, antepartum; and Obesity in pregnancy on her problem list.  Patient reports occ dizziness for 1 week.  Contractions: Irritability. Vag. Bleeding: None.  Movement: Present. Denies leaking of fluid.   The following portions of the patient's history were reviewed and updated as appropriate: allergies, current medications, past family history, past medical history, past social history, past surgical history and problem list. Problem list updated.  Objective:   Vitals:   08/25/16 1431  BP: 105/70  Pulse: (!) 104  Weight: 221 lb (100.2 kg)    Fetal Status: Fetal Heart Rate (bpm): 155   Movement: Present     General:  Alert, oriented and cooperative. Patient is in no acute distress.  Skin: Skin is warm and dry. No rash noted.   Cardiovascular: Normal heart rate noted  Respiratory: Normal respiratory effort, no problems with respiration noted  Abdomen: Soft, gravid, appropriate for gestational age.  Pain/Pressure: Present     Pelvic: Cervical exam deferred        Extremities: Normal range of motion.  Edema: Trace  Mental Status:  Normal mood and affect. Normal behavior. Normal judgment and thought content.   Assessment and Plan:  Pregnancy: G1P0000 at 4180w1d  1. Encounter for supervision of normal first pregnancy in third  trimester Pt declined childbirth classes  2. Pseudotumor cerebri syndrome  May take Dramamine for dizziness. May need to see Neuro if sx persist given history.   3. Obesity in pregnancy  4. Chlamydia infection affecting pregnancy in third trimester  Preterm labor symptoms and general obstetric precautions including but not limited to vaginal bleeding, contractions, leaking of fluid and fetal movement were reviewed in detail with the patient. Please refer to After Visit Summary for other counseling recommendations.  Return in about 2 weeks (around 09/08/2016).   Willodean Rosenthalarolyn Harraway-Smith, MD

## 2016-08-25 NOTE — Progress Notes (Signed)
Increase in dizziness x 1 week  not positional

## 2016-09-08 ENCOUNTER — Ambulatory Visit (INDEPENDENT_AMBULATORY_CARE_PROVIDER_SITE_OTHER): Payer: Medicaid Other | Admitting: Obstetrics & Gynecology

## 2016-09-08 ENCOUNTER — Other Ambulatory Visit (HOSPITAL_COMMUNITY)
Admission: RE | Admit: 2016-09-08 | Discharge: 2016-09-08 | Disposition: A | Discharge status: Home / Self Care | Payer: Medicaid Other | Source: Ambulatory Visit | Attending: Obstetrics & Gynecology | Admitting: Obstetrics & Gynecology

## 2016-09-08 VITALS — BP 110/76 | HR 94 | Wt 220.0 lb

## 2016-09-08 DIAGNOSIS — R102 Pelvic and perineal pain: Secondary | ICD-10-CM | POA: Diagnosis not present

## 2016-09-08 DIAGNOSIS — Z3403 Encounter for supervision of normal first pregnancy, third trimester: Secondary | ICD-10-CM

## 2016-09-08 DIAGNOSIS — O9989 Other specified diseases and conditions complicating pregnancy, childbirth and the puerperium: Secondary | ICD-10-CM

## 2016-09-08 NOTE — Progress Notes (Signed)
Red streaked mucous discharge. Urine- small blood    PRENATAL VISIT NOTE  Subjective:  Madison Guzman is a 21 y.o. G1P0000 at 8930w1d being seen today for ongoing prenatal care.  She is currently monitored for the following issues for this low-risk pregnancy and has Depression; Asthma, moderate persistent, well-controlled; Allergic rhinitis; Vitamin D deficiency; Migraine without aura and without status migrainosus, not intractable; Tension headache; Anxiety state, unspecified; Insomnia; Tinnitus; Chronic daily headache; Tinnitus of left ear; Pseudotumor cerebri; Pseudotumor cerebri syndrome; Supervision of normal pregnancy; Nausea and vomiting during pregnancy; Chlamydia infection affecting pregnancy; Pregnancy headache, antepartum; and Obesity in pregnancy on her problem list.  Patient reports 2 contractions / hour nad some red streaked vaginal mucous.  Contractions: Irritability. Vag. Bleeding: Scant.  Movement: Present. Denies leaking of fluid.   The following portions of the patient's history were reviewed and updated as appropriate: allergies, current medications, past family history, past medical history, past social history, past surgical history and problem list. Problem list updated.  Objective:   Vitals:   09/08/16 1602  BP: 110/76  Pulse: 94  Weight: 220 lb (99.8 kg)    Fetal Status:     Movement: Present     General:  Alert, oriented and cooperative. Patient is in no acute distress.  Skin: Skin is warm and dry. No rash noted.   Cardiovascular: Normal heart rate noted  Respiratory: Normal respiratory effort, no problems with respiration noted  Abdomen: Soft, gravid, appropriate for gestational age.  Pain/Pressure: Present     Pelvic: Cervical exam performed      closed/long/posterior  Extremities: Normal range of motion.  Edema: Trace  Mental Status:  Normal mood and affect. Normal behavior. Normal judgment and thought content.   Assessment and Plan:  Pregnancy: G1P0000 at  5830w1d  1. Encounter for supervision of normal first pregnancy in third trimester -still 20 pounds under from initial weight; has gained 4 pounds in past 2 weeks. -NO bllod on exam, cervix closed, vagianl d/c--bd affirm sent. - US MFM OB FOLLOW UP; Future  Preterm labor symptoms and general obstetric precautions including but not limited to vaginal bleeding, contractions, leaking of fluid and fetal movement were reviewed in detail with the patient. Please refer to After Visit Summary for other counseling recommendations.  Return in about 2 weeks (around 09/22/2016).   Elsie LincolnKelly Mccormick Macon, MD

## 2016-09-09 ENCOUNTER — Ambulatory Visit (HOSPITAL_COMMUNITY): Payer: Medicaid Other | Admitting: Psychiatry

## 2016-09-10 LAB — CERVICOVAGINAL ANCILLARY ONLY
Bacterial vaginitis: NEGATIVE
Candida vaginitis: POSITIVE — AB

## 2016-09-11 ENCOUNTER — Ambulatory Visit (HOSPITAL_COMMUNITY): Payer: Medicaid Other | Admitting: Licensed Clinical Social Worker

## 2016-09-15 ENCOUNTER — Telehealth: Payer: Self-pay | Admitting: *Deleted

## 2016-09-15 DIAGNOSIS — B3731 Acute candidiasis of vulva and vagina: Secondary | ICD-10-CM

## 2016-09-15 DIAGNOSIS — B373 Candidiasis of vulva and vagina: Secondary | ICD-10-CM

## 2016-09-15 MED ORDER — MICONAZOLE NITRATE 2 % VA CREA: TOPICAL_CREAM | VAGINAL | 0 refills | Status: DC

## 2016-09-15 NOTE — Telephone Encounter (Signed)
Pt notified of positive for vaginal yeast and [facility] Dr Penne LashLeggett.

## 2016-09-15 NOTE — Telephone Encounter (Signed)
-----   Message from Lesly DukesKelly H Leggett, MD sent at 09/11/2016  2:07 PM EDT ----- Treat yeast infection with monistat 7

## 2016-09-22 ENCOUNTER — Ambulatory Visit (INDEPENDENT_AMBULATORY_CARE_PROVIDER_SITE_OTHER): Payer: Medicaid Other | Admitting: Obstetrics & Gynecology

## 2016-09-22 VITALS — BP 115/75 | HR 101 | Wt 223.0 lb

## 2016-09-22 DIAGNOSIS — E669 Obesity, unspecified: Secondary | ICD-10-CM

## 2016-09-22 DIAGNOSIS — Z3403 Encounter for supervision of normal first pregnancy, third trimester: Secondary | ICD-10-CM

## 2016-09-22 DIAGNOSIS — O99213 Obesity complicating pregnancy, third trimester: Secondary | ICD-10-CM

## 2016-09-22 DIAGNOSIS — O99210 Obesity complicating pregnancy, unspecified trimester: Secondary | ICD-10-CM

## 2016-09-22 NOTE — Progress Notes (Signed)
   PRENATAL VISIT NOTE  Subjective:  Madison Guzman is a 21 y.o. G1P0000 at 2110w1d being seen today for ongoing prenatal care.  She is currently monitored for the following issues for this low-risk pregnancy and has Depression; Asthma, moderate persistent, well-controlled; Allergic rhinitis; Vitamin D deficiency; Migraine without aura and without status migrainosus, not intractable; Tension headache; Anxiety state, unspecified; Insomnia; Tinnitus; Chronic daily headache; Tinnitus of left ear; Pseudotumor cerebri; Pseudotumor cerebri syndrome; Supervision of normal pregnancy; Nausea and vomiting during pregnancy; Chlamydia infection affecting pregnancy; Pregnancy headache, antepartum; and Obesity in pregnancy on her problem list.  Patient reports complains of hip discomfort with sleeping.  Contractions: Irritability. Vag. Bleeding: None.  Movement: Present. Denies leaking of fluid.   The following portions of the patient's history were reviewed and updated as appropriate: allergies, current medications, past family history, past medical history, past social history, past surgical history and problem list. Problem list updated.  Objective:   Vitals:   09/22/16 1501  BP: 115/75  Pulse: (!) 101  Weight: 223 lb (101.2 kg)    Fetal Status: Fetal Heart Rate (bpm): 145   Movement: Present     General:  Alert, oriented and cooperative. Patient is in no acute distress.  Skin: Skin is warm and dry. No rash noted.   Cardiovascular: Normal heart rate noted  Respiratory: Normal respiratory effort, no problems with respiration noted  Abdomen: Soft, gravid, appropriate for gestational age.  Pain/Pressure: Present     Pelvic: Cervical exam deferred        Extremities: Normal range of motion.  Edema: Trace  Mental Status:  Normal mood and affect. Normal behavior. Normal judgment and thought content.   Assessment and Plan:  Pregnancy: G1P0000 at 9210w1d  1. Obesity in pregnancy - GBS at next visit - She  will get an appt with anesthesia to discuss epidural situation with her h/o pseudotumor cerebri  2. Encounter for supervision of normal first pregnancy in third trimester   Preterm labor symptoms and general obstetric precautions including but not limited to vaginal bleeding, contractions, leaking of fluid and fetal movement were reviewed in detail with the patient. Please refer to After Visit Summary for other counseling recommendations.  Return in about 1 week (around 09/29/2016) for GBS at next visit.   Allie BossierMyra C Sydni Elizarraraz, MD

## 2016-09-23 ENCOUNTER — Ambulatory Visit (HOSPITAL_COMMUNITY): Payer: Medicaid Other

## 2016-09-24 ENCOUNTER — Other Ambulatory Visit: Payer: Self-pay | Admitting: Obstetrics & Gynecology

## 2016-09-24 ENCOUNTER — Ambulatory Visit (HOSPITAL_COMMUNITY)
Admission: RE | Admit: 2016-09-24 | Discharge: 2016-09-24 | Disposition: A | Discharge status: Home / Self Care | Payer: Medicaid Other | Source: Ambulatory Visit | Attending: Obstetrics & Gynecology | Admitting: Obstetrics & Gynecology

## 2016-09-24 DIAGNOSIS — E669 Obesity, unspecified: Secondary | ICD-10-CM | POA: Insufficient documentation

## 2016-09-24 DIAGNOSIS — O2693 Pregnancy related conditions, unspecified, third trimester: Secondary | ICD-10-CM

## 2016-09-24 DIAGNOSIS — O99353 Diseases of the nervous system complicating pregnancy, third trimester: Secondary | ICD-10-CM | POA: Diagnosis not present

## 2016-09-24 DIAGNOSIS — O403XX0 Polyhydramnios, third trimester, not applicable or unspecified: Secondary | ICD-10-CM

## 2016-09-24 DIAGNOSIS — O99213 Obesity complicating pregnancy, third trimester: Secondary | ICD-10-CM | POA: Diagnosis not present

## 2016-09-24 DIAGNOSIS — Z6838 Body mass index (BMI) 38.0-38.9, adult: Secondary | ICD-10-CM | POA: Diagnosis not present

## 2016-09-24 DIAGNOSIS — Z362 Encounter for other antenatal screening follow-up: Secondary | ICD-10-CM

## 2016-09-24 DIAGNOSIS — G932 Benign intracranial hypertension: Secondary | ICD-10-CM | POA: Insufficient documentation

## 2016-09-24 DIAGNOSIS — Z3403 Encounter for supervision of normal first pregnancy, third trimester: Secondary | ICD-10-CM

## 2016-09-24 DIAGNOSIS — Z3A35 35 weeks gestation of pregnancy: Secondary | ICD-10-CM

## 2016-09-25 ENCOUNTER — Other Ambulatory Visit (HOSPITAL_COMMUNITY): Payer: Self-pay | Admitting: *Deleted

## 2016-09-25 DIAGNOSIS — O409XX0 Polyhydramnios, unspecified trimester, not applicable or unspecified: Secondary | ICD-10-CM

## 2016-09-26 ENCOUNTER — Encounter: Payer: Self-pay | Admitting: Obstetrics & Gynecology

## 2016-09-26 DIAGNOSIS — O403XX0 Polyhydramnios, third trimester, not applicable or unspecified: Secondary | ICD-10-CM | POA: Insufficient documentation

## 2016-09-30 ENCOUNTER — Ambulatory Visit (INDEPENDENT_AMBULATORY_CARE_PROVIDER_SITE_OTHER): Payer: Medicaid Other | Admitting: Obstetrics and Gynecology

## 2016-09-30 ENCOUNTER — Other Ambulatory Visit (HOSPITAL_COMMUNITY)
Admission: RE | Admit: 2016-09-30 | Discharge: 2016-09-30 | Disposition: A | Discharge status: Home / Self Care | Payer: Medicaid Other | Source: Ambulatory Visit | Attending: Obstetrics and Gynecology | Admitting: Obstetrics and Gynecology

## 2016-09-30 VITALS — BP 118/74 | HR 99 | Wt 219.0 lb

## 2016-09-30 DIAGNOSIS — O403XX0 Polyhydramnios, third trimester, not applicable or unspecified: Secondary | ICD-10-CM

## 2016-09-30 DIAGNOSIS — O99213 Obesity complicating pregnancy, third trimester: Secondary | ICD-10-CM

## 2016-09-30 DIAGNOSIS — O99210 Obesity complicating pregnancy, unspecified trimester: Secondary | ICD-10-CM

## 2016-09-30 DIAGNOSIS — Z3403 Encounter for supervision of normal first pregnancy, third trimester: Secondary | ICD-10-CM

## 2016-09-30 DIAGNOSIS — E669 Obesity, unspecified: Secondary | ICD-10-CM

## 2016-09-30 DIAGNOSIS — O409XX0 Polyhydramnios, unspecified trimester, not applicable or unspecified: Secondary | ICD-10-CM

## 2016-09-30 LAB — OB RESULTS CONSOLE GC/CHLAMYDIA: Gonorrhea: NEGATIVE

## 2016-09-30 LAB — OB RESULTS CONSOLE GBS: GBS: NEGATIVE

## 2016-09-30 NOTE — Progress Notes (Signed)
   PRENATAL VISIT NOTE  Subjective:  Madison Guzman is a 21 y.o. G1P0000 at [redacted]w[redacted]d being seen today for ongoing prenatal care.  She is currently monitored for the following issues for this high-risk pregnancy and has Depression; Asthma, moderate persistent, well-controlled; Allergic rhinitis; Vitamin D deficiency; Migraine without aura and without status migrainosus, not intractable; Tension headache; Anxiety state, unspecified; Insomnia; Tinnitus; Chronic daily headache; Tinnitus of left ear; Pseudotumor cerebri; Pseudotumor cerebri syndrome; Supervision of normal pregnancy; Nausea and vomiting during pregnancy; Chlamydia infection affecting pregnancy; Pregnancy headache, antepartum; Obesity in pregnancy; and Polyhydramnios affecting pregnancy in third trimester on her problem list.  Patient reports no complaints.  Contractions: Irritability. Vag. Bleeding: None.  Movement: Present. Denies leaking of fluid.   The following portions of the patient's history were reviewed and updated as appropriate: allergies, current medications, past family history, past medical history, past social history, past surgical history and problem list. Problem list updated.  Objective:   Vitals:   09/30/16 1324  BP: 118/74  Pulse: 99  Weight: 219 lb (99.3 kg)    Fetal Status: Fetal Heart Rate (bpm): 150 Fundal Height: 38 cm Movement: Present  Presentation: Vertex  General:  Alert, oriented and cooperative. Patient is in no acute distress.  Skin: Skin is warm and dry. No rash noted.   Cardiovascular: Normal heart rate noted  Respiratory: Normal respiratory effort, no problems with respiration noted  Abdomen: Soft, gravid, appropriate for gestational age.  Pain/Pressure: Present     Pelvic: Cervical exam performed Dilation: Closed Effacement (%): Thick Station: Ballotable  Extremities: Normal range of motion.  Edema: Trace  Mental Status:  Normal mood and affect. Normal behavior. Normal judgment and thought  content.   Assessment and Plan:  Pregnancy: G1P0000 at [redacted]w[redacted]d  1. Polyhydramnios affecting pregnancy Continue weekly BPP. Next scheduled for 8/22 - Urine cytology ancillary only - Culture, beta strep (group b only)  2. Encounter for supervision of normal first pregnancy in third trimester Patient is doing well  Answered questions regarding IOL Patient still needs to meet with anesthesia to assess if she is a candidate for epidural given history of pseudotumor cerebri Cultures today  3. Obesity in pregnancy   Preterm labor symptoms and general obstetric precautions including but not limited to vaginal bleeding, contractions, leaking of fluid and fetal movement were reviewed in detail with the patient. Please refer to After Visit Summary for other counseling recommendations.  Return in about 1 week (around 10/07/2016) for ROB.   Catalina Antigua, MD

## 2016-10-01 ENCOUNTER — Ambulatory Visit (HOSPITAL_COMMUNITY)
Admission: RE | Admit: 2016-10-01 | Discharge: 2016-10-01 | Disposition: A | Discharge status: Home / Self Care | Payer: Medicaid Other | Source: Ambulatory Visit | Attending: Obstetrics & Gynecology | Admitting: Obstetrics & Gynecology

## 2016-10-01 ENCOUNTER — Encounter (HOSPITAL_COMMUNITY): Payer: Self-pay

## 2016-10-01 DIAGNOSIS — O99213 Obesity complicating pregnancy, third trimester: Secondary | ICD-10-CM | POA: Insufficient documentation

## 2016-10-01 DIAGNOSIS — O409XX0 Polyhydramnios, unspecified trimester, not applicable or unspecified: Secondary | ICD-10-CM | POA: Diagnosis present

## 2016-10-01 DIAGNOSIS — O09893 Supervision of other high risk pregnancies, third trimester: Secondary | ICD-10-CM | POA: Diagnosis not present

## 2016-10-01 DIAGNOSIS — Z3A36 36 weeks gestation of pregnancy: Secondary | ICD-10-CM | POA: Diagnosis not present

## 2016-10-01 DIAGNOSIS — O2693 Pregnancy related conditions, unspecified, third trimester: Secondary | ICD-10-CM | POA: Diagnosis not present

## 2016-10-01 DIAGNOSIS — Z362 Encounter for other antenatal screening follow-up: Secondary | ICD-10-CM | POA: Insufficient documentation

## 2016-10-02 ENCOUNTER — Encounter (HOSPITAL_COMMUNITY): Payer: Self-pay | Admitting: *Deleted

## 2016-10-02 ENCOUNTER — Inpatient Hospital Stay (HOSPITAL_COMMUNITY)
Admission: AD | Admit: 2016-10-02 | Discharge: 2016-10-02 | Disposition: A | Discharge status: Home / Self Care | Payer: Medicaid Other | Source: Ambulatory Visit | Attending: Obstetrics & Gynecology | Admitting: Obstetrics & Gynecology

## 2016-10-02 DIAGNOSIS — R109 Unspecified abdominal pain: Secondary | ICD-10-CM | POA: Insufficient documentation

## 2016-10-02 DIAGNOSIS — Z3A36 36 weeks gestation of pregnancy: Secondary | ICD-10-CM | POA: Insufficient documentation

## 2016-10-02 DIAGNOSIS — O99283 Endocrine, nutritional and metabolic diseases complicating pregnancy, third trimester: Secondary | ICD-10-CM | POA: Insufficient documentation

## 2016-10-02 DIAGNOSIS — O26893 Other specified pregnancy related conditions, third trimester: Secondary | ICD-10-CM | POA: Insufficient documentation

## 2016-10-02 DIAGNOSIS — O403XX0 Polyhydramnios, third trimester, not applicable or unspecified: Secondary | ICD-10-CM | POA: Insufficient documentation

## 2016-10-02 DIAGNOSIS — O219 Vomiting of pregnancy, unspecified: Secondary | ICD-10-CM | POA: Diagnosis not present

## 2016-10-02 DIAGNOSIS — O99213 Obesity complicating pregnancy, third trimester: Secondary | ICD-10-CM | POA: Insufficient documentation

## 2016-10-02 DIAGNOSIS — J4540 Moderate persistent asthma, uncomplicated: Secondary | ICD-10-CM | POA: Insufficient documentation

## 2016-10-02 DIAGNOSIS — O368130 Decreased fetal movements, third trimester, not applicable or unspecified: Secondary | ICD-10-CM

## 2016-10-02 DIAGNOSIS — O99343 Other mental disorders complicating pregnancy, third trimester: Secondary | ICD-10-CM | POA: Insufficient documentation

## 2016-10-02 DIAGNOSIS — O99210 Obesity complicating pregnancy, unspecified trimester: Secondary | ICD-10-CM

## 2016-10-02 DIAGNOSIS — E559 Vitamin D deficiency, unspecified: Secondary | ICD-10-CM | POA: Insufficient documentation

## 2016-10-02 DIAGNOSIS — O99513 Diseases of the respiratory system complicating pregnancy, third trimester: Secondary | ICD-10-CM | POA: Diagnosis not present

## 2016-10-02 NOTE — MAU Provider Note (Signed)
Chief Complaint  Patient presents with  . Decreased Fetal Movement  . Abdominal Pain     First Provider Initiated Contact with Patient 10/02/16 1829      S: Madison Guzman  is a 21 y.o. y.o. year old G36P0000 female at [redacted]w[redacted]d weeks gestation who presents to [facility]U reporting decreased fetal movement since last night and possible contractions.   Contractions: Few Vaginal bleeding: denies Leaking of fluid: Denies  Patient Active Problem List   Diagnosis Date Noted  . Polyhydramnios affecting pregnancy in third trimester 09/26/2016  . Obesity in pregnancy 06/03/2016  . Pregnancy headache, antepartum 05/23/2016  . Chlamydia infection affecting pregnancy 04/09/2016  . Supervision of normal pregnancy 03/11/2016  . Nausea and vomiting during pregnancy 03/11/2016  . Pseudotumor cerebri syndrome 09/22/2014  . Pseudotumor cerebri 10/06/2013  . Tinnitus 06/16/2013  . Chronic daily headache 06/16/2013  . 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 Migraine without aura and without status migrainosus, not intractable 12/01/2012  . Tension headache 12/01/2012  . Anxiety state, unspecified 12/01/2012  . Insomnia 12/01/2012  . Depression 09/16/2012  . Asthma, moderate persistent, well-controlled 09/16/2012  . Allergic rhinitis 09/16/2012  . Vitamin D deficiency 04/15/2012    O:  Patient Vitals for the past 24 hrs:  BP Temp Temp src Pulse Resp SpO2  10/02/16 1711 135/71 98.5 F (36.9 C) Oral 97 18 100 %   General: NAD Heart: Regular rate Lungs: Normal rate and effort Abd: Soft, NT, Gravid, S=D Pelvic: NEFG, no blood.     NST performed EFM: 145, reactive Toco: Rare, mild Cervical exam: declined  A: [redacted]w[redacted]d week IUP Decreased fetal movement resolved w/ PO fluids with reactive NST.  P: Discharge home in stable condition. Labor precautions and fetal kick counts. Follow-up as scheduled for prenatal visit or sooner as needed if symptoms worsen. Return to maternity admissions as needed if  symptoms worsen.  Katrinka Blazing, [place], [place] 10/02/2016 6:54 PM  2

## 2016-10-02 NOTE — Discharge Instructions (Signed)
Braxton Hicks Contractions °Contractions of the uterus can occur throughout pregnancy, but they are not always a sign that you are in labor. You may have practice contractions called Braxton Hicks contractions. These false labor contractions are sometimes confused with true labor. °What are Braxton Hicks contractions? °Braxton Hicks contractions are tightening movements that occur in the muscles of the uterus before labor. Unlike true labor contractions, these contractions do not result in opening (dilation) and thinning of the cervix. Toward the end of pregnancy (32-34 weeks), Braxton Hicks contractions can happen more often and may become stronger. These contractions are sometimes difficult to tell apart from true labor because they can be very uncomfortable. You should not feel embarrassed if you go to the hospital with false labor. °Sometimes, the only way to tell if you are in true labor is for your health care provider to look for changes in the cervix. The health care provider will do a physical exam and may monitor your contractions. If you are not in true labor, the exam should show that your cervix is not dilating and your water has not broken. °If there are no prenatal problems or other health problems associated with your pregnancy, it is completely safe for you to be sent home with false labor. You may continue to have Braxton Hicks contractions until you go into true labor. °How can I tell the difference between true labor and false labor? °· Differences °? False labor °? Contractions last 30-70 seconds.: Contractions are usually shorter and not as strong as true labor contractions. °? Contractions become very regular.: Contractions are usually irregular. °? Discomfort is usually felt in the top of the uterus, and it spreads to the lower abdomen and low back.: Contractions are often felt in the front of the lower abdomen and in the groin. °? Contractions do not go away with walking.: Contractions may  go away when you walk around or change positions while lying down. °? Contractions usually become more intense and increase in frequency.: Contractions get weaker and are shorter-lasting as time goes on. °? The cervix dilates and gets thinner.: The cervix usually does not dilate or become thin. °Follow these instructions at home: °· Take over-the-counter and prescription medicines only as told by your health care provider. °· Keep up with your usual exercises and follow other instructions from your health care provider. °· Eat and drink lightly if you think you are going into labor. °· If Braxton Hicks contractions are making you uncomfortable: °? Change your position from lying down or resting to walking, or change from walking to resting. °? Sit and rest in a tub of warm water. °? Drink enough fluid to keep your urine clear or pale yellow. Dehydration may cause these contractions. °? Do slow and deep breathing several times an hour. °· Keep all follow-up prenatal visits as told by your health care provider. This is important. °Contact a health care provider if: °· You have a fever. °· You have continuous pain in your abdomen. °Get help right away if: °· Your contractions become stronger, more regular, and closer together. °· You have fluid leaking or gushing from your vagina. °· You pass blood-tinged mucus (bloody show). °· You have bleeding from your vagina. °· You have low back pain that you never had before. °· You feel your baby’s head pushing down and causing pelvic pressure. °· Your baby is not moving inside you as much as it used to. °Summary °· Contractions that occur before labor are   called Braxton Hicks contractions, false labor, or practice contractions. °· Braxton Hicks contractions are usually shorter, weaker, farther apart, and less regular than true labor contractions. True labor contractions usually become progressively stronger and regular and they become more frequent. °· Manage discomfort from  Braxton Hicks contractions by changing position, resting in a warm bath, drinking plenty of water, or practicing deep breathing. °This information is not intended to replace advice given to you by your health care provider. Make sure you discuss any questions you have with your health care provider. °Document Released: 01/27/2005 Document Revised: 12/17/2015 Document Reviewed: 12/17/2015 °Elsevier Interactive Patient Education © 2017 [employer] ° ° °Fetal Movement Counts °Patient Name: ________________________________________________ Patient Due Date: ____________________ °What is a fetal movement count? °A fetal movement count is the number of times that you feel your baby move during a certain amount of time. This may also be called a fetal kick count. A fetal movement count is recommended for every pregnant woman. You may be asked to start counting fetal movements as early as week 28 of your pregnancy. °Pay attention to when your baby is most active. You may notice your baby's sleep and wake cycles. You may also notice things that make your baby move more. You should do a fetal movement count: °· When your baby is normally most active. °· At the same time each day. ° °A good time to count movements is while you are resting, after having something to eat and drink. °How do I count fetal movements? °1. Find a quiet, comfortable area. Sit, or lie down on your side. °2. Write down the date, the start time and stop time, and the number of movements that you felt between those two times. Take this information with you to your health care visits. °3. For 2 hours, count kicks, flutters, swishes, rolls, and jabs. You should feel at least 10 movements during 2 hours. °4. You may stop counting after you have felt 10 movements. °5. If you do not feel 10 movements in 2 hours, have something to eat and drink. Then, keep resting and counting for 1 hour. If you feel at least 4 movements during that hour, you may stop  counting. °Contact a health care provider if: °· You feel fewer than 4 movements in 2 hours. °· Your baby is not moving like he or she usually does. °Date: ____________ Start time: ____________ Stop time: ____________ Movements: ____________ °Date: ____________ Start time: ____________ Stop time: ____________ Movements: ____________ °Date: ____________ Start time: ____________ Stop time: ____________ Movements: ____________ °Date: ____________ Start time: ____________ Stop time: ____________ Movements: ____________ °Date: ____________ Start time: ____________ Stop time: ____________ Movements: ____________ °Date: ____________ Start time: ____________ Stop time: ____________ Movements: ____________ °Date: ____________ Start time: ____________ Stop time: ____________ Movements: ____________ °Date: ____________ Start time: ____________ Stop time: ____________ Movements: ____________ °Date: ____________ Start time: ____________ Stop time: ____________ Movements: ____________ °This information is not intended to replace advice given to you by your health care provider. Make sure you discuss any questions you have with your health care provider. °Document Released: 02/26/2006 Document Revised: 09/26/2015 Document Reviewed: 03/08/2015 °Elsevier Interactive Patient Education © 2018 [employer] Inc[employer] ° °

## 2016-10-02 NOTE — MAU Note (Signed)
Pt presents with c/o decreased fetal movement since yesterday morning.  Pt also reports lower abdominal "menstrual type" cramping since 1245 this afternoon.  Denies LOF or VB.

## 2016-10-02 NOTE — MAU Note (Signed)
+  lower back pain ?contractions For past 2-3 days  States lost mucous plug  Decreased fetal movement; less than normal

## 2016-10-03 LAB — URINE CYTOLOGY ANCILLARY ONLY
Chlamydia: NEGATIVE
Neisseria Gonorrhea: NEGATIVE

## 2016-10-04 LAB — CULTURE, BETA STREP (GROUP B ONLY)

## 2016-10-06 ENCOUNTER — Encounter: Payer: Self-pay | Admitting: Obstetrics and Gynecology

## 2016-10-06 DIAGNOSIS — O99820 Streptococcus B carrier state complicating pregnancy: Secondary | ICD-10-CM | POA: Insufficient documentation

## 2016-10-07 ENCOUNTER — Ambulatory Visit (INDEPENDENT_AMBULATORY_CARE_PROVIDER_SITE_OTHER): Payer: Medicaid Other | Admitting: Obstetrics & Gynecology

## 2016-10-07 VITALS — BP 120/74 | HR 114 | Wt 221.0 lb

## 2016-10-07 DIAGNOSIS — O99210 Obesity complicating pregnancy, unspecified trimester: Secondary | ICD-10-CM

## 2016-10-07 DIAGNOSIS — Z3403 Encounter for supervision of normal first pregnancy, third trimester: Secondary | ICD-10-CM

## 2016-10-07 NOTE — Progress Notes (Signed)
   PRENATAL VISIT NOTE  Subjective:  Madison Guzman is a 21 y.o. G1P0000 at [redacted]w[redacted]d being seen today for ongoing prenatal care.  She is currently monitored for the following issues for this high-risk pregnancy and has Depression; Asthma, moderate persistent, well-controlled; Allergic rhinitis; Vitamin D deficiency; Migraine without aura and without status migrainosus, not intractable; Tension headache; Anxiety state, unspecified; Insomnia; Tinnitus; Chronic daily headache; Tinnitus of left ear; Pseudotumor cerebri; Pseudotumor cerebri syndrome; Supervision of normal pregnancy; Nausea and vomiting during pregnancy; Chlamydia infection affecting pregnancy; Pregnancy headache, antepartum; Obesity in pregnancy; Polyhydramnios affecting pregnancy in third trimester; and GBS (group B Streptococcus carrier), +RV culture, currently pregnant on her problem list.  Patient reports no complaints.  Contractions: Irritability. Vag. Bleeding: None.  Movement: Absent. Denies leaking of fluid.   The following portions of the patient's history were reviewed and updated as appropriate: allergies, current medications, past family history, past medical history, past social history, past surgical history and problem list. Problem list updated.  Objective:   Vitals:   10/07/16 1331  BP: 120/74  Pulse: (!) 114  Weight: 221 lb (100.2 kg)    Fetal Status:     Movement: Absent     General:  Alert, oriented and cooperative. Patient is in no acute distress.  Skin: Skin is warm and dry. No rash noted.   Cardiovascular: Normal heart rate noted  Respiratory: Normal respiratory effort, no problems with respiration noted  Abdomen: Soft, gravid, appropriate for gestational age.  Pain/Pressure: Present     Pelvic: Cervical exam performed        Extremities: Normal range of motion.  Edema: Trace  Mental Status:  Normal mood and affect. Normal behavior. Normal judgment and thought content.   Assessment and Plan:  Pregnancy:  G1P0000 at [redacted]w[redacted]d  1. Encounter for supervision of normal first pregnancy in third trimester  She feels fatigue so I will check a CBC. 2. Obesity in pregnancy  Preterm labor symptoms and general obstetric precautions including but not limited to vaginal bleeding, contractions, leaking of fluid and fetal movement were reviewed in detail with the patient. Please refer to After Visit Summary for other counseling recommendations.  Return in about 1 week (around 10/14/2016).   Allie Bossier, MD

## 2016-10-08 ENCOUNTER — Encounter (HOSPITAL_COMMUNITY): Payer: Self-pay

## 2016-10-08 ENCOUNTER — Ambulatory Visit (HOSPITAL_COMMUNITY)
Admission: RE | Admit: 2016-10-08 | Discharge: 2016-10-08 | Disposition: A | Discharge status: Home / Self Care | Payer: Medicaid Other | Source: Ambulatory Visit | Attending: Obstetrics & Gynecology | Admitting: Obstetrics & Gynecology

## 2016-10-08 DIAGNOSIS — Z3A37 37 weeks gestation of pregnancy: Secondary | ICD-10-CM | POA: Diagnosis not present

## 2016-10-08 DIAGNOSIS — O403XX0 Polyhydramnios, third trimester, not applicable or unspecified: Secondary | ICD-10-CM | POA: Diagnosis present

## 2016-10-08 DIAGNOSIS — E669 Obesity, unspecified: Secondary | ICD-10-CM | POA: Diagnosis not present

## 2016-10-08 DIAGNOSIS — O99213 Obesity complicating pregnancy, third trimester: Secondary | ICD-10-CM | POA: Diagnosis not present

## 2016-10-08 DIAGNOSIS — O409XX0 Polyhydramnios, unspecified trimester, not applicable or unspecified: Secondary | ICD-10-CM

## 2016-10-08 LAB — CBC
HCT: 37.6 % (ref 35.0–45.0)
Hemoglobin: 12.2 g/dL (ref 11.7–15.5)
MCH: 27.8 pg (ref 27.0–33.0)
MCHC: 32.4 g/dL (ref 32.0–36.0)
MCV: 85.6 fL (ref 80.0–100.0)
MPV: 9.1 fL (ref 7.5–12.5)
Platelets: 362 10*3/uL (ref 140–400)
RBC: 4.39 MIL/uL (ref 3.80–5.10)
RDW: 14 % (ref 11.0–15.0)
WBC: 12.9 10*3/uL — ABNORMAL HIGH (ref 3.8–10.8)

## 2016-10-08 NOTE — Addendum Note (Signed)
Encounter addended by: Joycelyn Rua Mae on: 10/08/2016  3:07 PM<BR>    Actions taken: Imaging Exam ended

## 2016-10-15 ENCOUNTER — Ambulatory Visit (HOSPITAL_COMMUNITY)
Admission: RE | Admit: 2016-10-15 | Discharge: 2016-10-15 | Disposition: A | Discharge status: Home / Self Care | Payer: Medicaid Other | Source: Ambulatory Visit | Attending: Obstetrics & Gynecology | Admitting: Obstetrics & Gynecology

## 2016-10-15 ENCOUNTER — Encounter (HOSPITAL_COMMUNITY): Payer: Self-pay

## 2016-10-15 DIAGNOSIS — O403XX0 Polyhydramnios, third trimester, not applicable or unspecified: Secondary | ICD-10-CM | POA: Insufficient documentation

## 2016-10-15 DIAGNOSIS — O409XX0 Polyhydramnios, unspecified trimester, not applicable or unspecified: Secondary | ICD-10-CM | POA: Diagnosis present

## 2016-10-15 DIAGNOSIS — O99213 Obesity complicating pregnancy, third trimester: Secondary | ICD-10-CM | POA: Insufficient documentation

## 2016-10-15 DIAGNOSIS — O2690 Pregnancy related conditions, unspecified, unspecified trimester: Secondary | ICD-10-CM | POA: Insufficient documentation

## 2016-10-15 DIAGNOSIS — O99820 Streptococcus B carrier state complicating pregnancy: Secondary | ICD-10-CM

## 2016-10-15 DIAGNOSIS — O99210 Obesity complicating pregnancy, unspecified trimester: Secondary | ICD-10-CM

## 2016-10-15 DIAGNOSIS — Z3A38 38 weeks gestation of pregnancy: Secondary | ICD-10-CM | POA: Diagnosis not present

## 2016-10-15 NOTE — Addendum Note (Signed)
Encounter addended by: Earley Brookealrymple, Mirca Yale S on: 10/15/2016  2:51 PM<BR>    Actions taken: Imaging Exam ended

## 2016-10-16 ENCOUNTER — Ambulatory Visit (INDEPENDENT_AMBULATORY_CARE_PROVIDER_SITE_OTHER): Payer: Medicaid Other | Admitting: Obstetrics & Gynecology

## 2016-10-16 DIAGNOSIS — O403XX0 Polyhydramnios, third trimester, not applicable or unspecified: Secondary | ICD-10-CM

## 2016-10-16 DIAGNOSIS — Z3403 Encounter for supervision of normal first pregnancy, third trimester: Secondary | ICD-10-CM

## 2016-10-16 NOTE — Progress Notes (Signed)
   PRENATAL VISIT NOTE  Subjective:  Madison Guzman is a 21 y.o. G1P0000 at 6431w2d being seen today for ongoing prenatal care.  She is currently monitored for the following issues for this low-risk pregnancy and has Depression; Asthma, moderate persistent, well-controlled; Allergic rhinitis; Vitamin D deficiency; Migraine without aura and without status migrainosus, not intractable; Tension headache; Anxiety state, unspecified; Insomnia; Tinnitus; Chronic daily headache; Tinnitus of left ear; Pseudotumor cerebri; Pseudotumor cerebri syndrome; Supervision of normal pregnancy; Nausea and vomiting during pregnancy; Chlamydia infection affecting pregnancy; Pregnancy headache, antepartum; Obesity in pregnancy; Polyhydramnios affecting pregnancy in third trimester; and GBS (group B Streptococcus carrier), +RV culture, currently pregnant on her problem list.  Patient reports cramping.  Contractions: Irritability. Vag. Bleeding: None.  Movement: Present. Denies leaking of fluid.   The following portions of the patient's history were reviewed and updated as appropriate: allergies, current medications, past family history, past medical history, past social history, past surgical history and problem list. Problem list updated.  Objective:   Vitals:   10/16/16 1413  BP: 127/74  Pulse: (!) 122  Weight: 225 lb (102.1 kg)    Fetal Status: Fetal Heart Rate (bpm): 147   Movement: Present     General:  Alert, oriented and cooperative. Patient is in no acute distress.  Skin: Skin is warm and dry. No rash noted.   Cardiovascular: Normal heart rate noted  Respiratory: Normal respiratory effort, no problems with respiration noted  Abdomen: Soft, gravid, appropriate for gestational age.  Pain/Pressure: Present     Pelvic: Cervical exam performed      tight 1/50/bal  Extremities: Normal range of motion.  Edema: Trace  Mental Status:  Normal mood and affect. Normal behavior. Normal judgment and thought content.    Assessment and Plan:  Pregnancy: G1P0000 at 1731w2d  1. Encounter for supervision of normal first pregnancy in third trimester Labor precautions reviewed  2. Polyhydramnios affecting pregnancy in third trimester RESOLVED.  No ned for induction.  Term labor symptoms and general obstetric precautions including but not limited to vaginal bleeding, contractions, leaking of fluid and fetal movement were reviewed in detail with the patient. Please refer to After Visit Summary for other counseling recommendations.  No Follow-up on file.   Elsie LincolnKelly Mitsuo Budnick, MD

## 2016-10-23 ENCOUNTER — Ambulatory Visit (INDEPENDENT_AMBULATORY_CARE_PROVIDER_SITE_OTHER): Payer: Medicaid Other | Admitting: Obstetrics & Gynecology

## 2016-10-23 VITALS — BP 121/78 | HR 92 | Wt 223.0 lb

## 2016-10-23 DIAGNOSIS — Z3403 Encounter for supervision of normal first pregnancy, third trimester: Secondary | ICD-10-CM

## 2016-10-23 NOTE — Patient Instructions (Signed)
Return to clinic for any scheduled appointments or obstetric concerns, or go to [facility] for evaluation  

## 2016-10-23 NOTE — Progress Notes (Signed)
   PRENATAL VISIT NOTE  Subjective:  Madison Guzman is a 21 y.o. G1P0000 at 3223w2d being seen today for ongoing prenatal care.  She is currently monitored for the following issues for this low-risk pregnancy and has Depression; Asthma, moderate persistent, well-controlled; Allergic rhinitis; Vitamin D deficiency; Migraine without aura and without status migrainosus, not intractable; Tension headache; Anxiety state, unspecified; Insomnia; Tinnitus; Chronic daily headache; Tinnitus of left ear; Pseudotumor cerebri syndrome; Supervision of normal pregnancy; Nausea and vomiting during pregnancy; Chlamydia infection affecting pregnancy; Pregnancy headache, antepartum; Obesity in pregnancy; and GBS (group B Streptococcus carrier), +RV culture, currently pregnant on her problem list.  Patient reports occasional contractions.  Contractions: Irritability. Vag. Bleeding: None.  Movement: Present. Denies leaking of fluid.   The following portions of the patient's history were reviewed and updated as appropriate: allergies, current medications, past family history, past medical history, past social history, past surgical history and problem list. Problem list updated.  Objective:   Vitals:   10/23/16 1505  BP: 121/78  Pulse: 92  Weight: 223 lb (101.2 kg)    Fetal Status: Fetal Heart Rate (bpm): 148 Fundal Height: 39 cm Movement: Present  Presentation: Vertex  General:  Alert, oriented and cooperative. Patient is in no acute distress.  Skin: Skin is warm and dry. No rash noted.   Cardiovascular: Normal heart rate noted  Respiratory: Normal respiratory effort, no problems with respiration noted  Abdomen: Soft, gravid, appropriate for gestational age.  Pain/Pressure: Present     Pelvic: Cervical exam performed Dilation: Fingertip Effacement (%): 40 Station: Ballotable  Extremities: Normal range of motion.  Edema: Trace  Mental Status:  Normal mood and affect. Normal behavior. Normal judgment and thought  content.   Assessment and Plan:  Pregnancy: G1P0000 at 3423w2d  1. Encounter for supervision of normal first pregnancy in third trimester Term labor symptoms and general obstetric precautions including but not limited to vaginal bleeding, contractions, leaking of fluid and fetal movement were reviewed in detail with the patient. Please refer to After Visit Summary for other counseling recommendations.  Return in about 1 week (around 10/30/2016) for OB Visit, NST, AFI     11/03/16 NST only  .   Jaynie CollinsUgonna Shaul Trautman, MD

## 2016-10-27 ENCOUNTER — Encounter (HOSPITAL_COMMUNITY): Payer: Self-pay | Admitting: *Deleted

## 2016-10-27 ENCOUNTER — Telehealth (HOSPITAL_COMMUNITY): Payer: Self-pay | Admitting: *Deleted

## 2016-10-27 NOTE — Telephone Encounter (Signed)
Preadmission screen  

## 2016-10-30 ENCOUNTER — Ambulatory Visit (INDEPENDENT_AMBULATORY_CARE_PROVIDER_SITE_OTHER): Payer: Medicaid Other | Admitting: Obstetrics & Gynecology

## 2016-10-30 ENCOUNTER — Other Ambulatory Visit: Payer: Self-pay | Admitting: Advanced Practice Midwife

## 2016-10-30 VITALS — BP 127/70 | HR 92 | Wt 224.0 lb

## 2016-10-30 DIAGNOSIS — Z3400 Encounter for supervision of normal first pregnancy, unspecified trimester: Secondary | ICD-10-CM

## 2016-10-30 DIAGNOSIS — O480 Post-term pregnancy: Secondary | ICD-10-CM

## 2016-11-03 NOTE — H&P (Signed)
OBSTETRIC ADMISSION HISTORY AND PHYSICAL  Madison Guzman is a 21 y.o. female G1P0000 with IUP at [redacted]w[redacted]d by early ultrasound presenting for IOL secondary to post-dates pregnancy. Patient reports migraine headache and blurry vision currently. She has suffered from migraines since she was 34 and states current episode is unchanged from what she has experienced since 21 y/o. She states blurry vision is typical for her migraine headaches and is no worse than usual. Patient reports some peripheral edema and left sided flank pain which started 2 days ago. She reports +FMs, No LOF, no VB. No fever, chills, nausea or vomiting. She plans on breast feeding. She unsure of contraception at this time. She received her prenatal care at [facility]   Dating: By 7 wk ultrasound --->  Estimated Date of Delivery: 10/28/16  Sono:    , CWD, normal anatomy, cephalic presentation, placenta posterior, 2678g, 63% EFW   Prenatal History/Complications: Chlamydia in pregnancy, treated, had test of cure Polyhydraminos resolved at 38 weeks Past Medical History: Past Medical History:  Diagnosis Date  . Asthma   . Depression   . Migraine    Pt reports since age of 18  . Pharyngitis 01/08/2013   [facility] - strep neg    Past Surgical History: Past Surgical History:  Procedure Laterality Date  . TYMPANOSTOMY TUBE PLACEMENT Bilateral     Obstetrical History: OB History    Gravida Para Term Preterm AB Living   1 0 0 0 0 0   SAB TAB Ectopic Multiple Live Births   0 0 0 0        Social History: Social History   Social History  . Marital status: Single    Spouse name: N/A  . Number of children: N/A  . Years of education: N/A   Social History Main Topics  . Smoking status: Passive Smoke Exposure - Never Smoker  . Smokeless tobacco: Never Used  . Alcohol use No  . Drug use: No  . Sexual activity: Not Currently    Birth control/ protection: None     Comment: Patient stated that she does  not always take the [facility] pill as prescribed 04/12/14 TW   Other Topics Concern  . Not on file   Social History Narrative   Anjeli attends [employer]. She is doing well.   Generally stays at maternal grandmothers home across from parents.  Has her own room there and the 2 households interact well.    Family History: Family History  Problem Relation Age of Onset  . Asthma Mother   . Hypertension Mother   . Diabetes Maternal Grandmother   . Hyperlipidemia Maternal Grandmother   . Hypertension Maternal Grandmother   . Mental illness Maternal Grandmother   . Migraines Maternal Grandmother   . Seizures Father   . ADD / ADHD Brother        1 Younger brother has ADHD  . Mental illness Maternal Aunt        Bipolar  . ADD / ADHD Maternal Uncle     Allergies: No Known Allergies   (Not in a hospital admission)   Review of Systems   All systems reviewed and negative except as stated in HPI  Last menstrual period 12/13/2015. General appearance: alert, cooperative, mild distress and mildly obese Lungs: clear to auscultation bilaterally Heart: regular rate and rhythm Abdomen: soft, non-tender; bowel sounds normal Pelvic: deferred  Extremities: Homans sign is negative, no sign of DVT DTR's 2+  Presentation: cephalic Fetal  monitoringBaseline: 150 bpm, Variability: Good {> 6 bpm), Accelerations: 10 x 10 and Decelerations: Absent Uterine activity: variable      Prenatal labs: ABO, Rh: A/POS/-- (01/30 1516) Antibody: NEG (01/30 1516) Rubella: 1.04 (01/30 1516) RPR: NON REAC (06/18 0905)  HBsAg: NEGATIVE (01/30 1516)  HIV: NONREACTIVE (06/18 0905)  GBS:   positive 1 hr Glucola 88 (normal) Genetic screening  Normal  Anatomy US normal  Prenatal Transfer Tool  Maternal Diabetes: No Genetic Screening: Normal Maternal Ultrasounds/Referrals: Normal Fetal Ultrasounds or other Referrals:  None Maternal Substance Abuse:  No Significant Maternal Medications:   None Significant Maternal Lab Results: Lab values include: Group B Strep positive  No results found for this or any previous visit (from the past 24 hour(s)).  Patient Active Problem List   Diagnosis Date Noted  . GBS (group B Streptococcus carrier), +RV culture, currently pregnant 10/06/2016  . Obesity in pregnancy 06/03/2016  . Pregnancy headache, antepartum 05/23/2016  . Chlamydia infection affecting pregnancy 04/09/2016  . Supervision of normal pregnancy 03/11/2016  . Nausea and vomiting during pregnancy 03/11/2016  . Pseudotumor cerebri syndrome 09/22/2014  . Tinnitus 06/16/2013  . Chronic daily headache 06/16/2013  . 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 Migraine without aura and without status migrainosus, not intractable 12/01/2012  . Tension headache 12/01/2012  . Anxiety state, unspecified 12/01/2012  . Insomnia 12/01/2012  . Depression 09/16/2012  . Asthma, moderate persistent, well-controlled 09/16/2012  . Allergic rhinitis 09/16/2012  . Vitamin D deficiency 04/15/2012    Assessment/Plan:  Madison Guzman is a 21 y.o. G1P0000 at [redacted]w[redacted]d here for post dates IOL.   #Labor:IOL with cytotec per protocol #Pain: unsure #FWB: Category 1  #ID:  GBS +, will get penicillin  #MOF: breast #MOC:POP #Circ:  n/a  Dyke Maes Cimolino, Student-PA  11/03/2016, 10:39 PM   CNM attestation:  I have seen and examined this patient; I agree with above documentation in the PA student's note.   Madison Guzman is a 21 y.o. G1P0000 @ 41.0 by 7wk scan here for IOL due to postdates. She has a hx of pseudotumor cerebri with frequent H/A since she was a child. Denies visual disturbances or RUQ pain. H/A usually relieved by Tylenol. She has not seen neuro during the preg. Was seen by psych during the preg due to GAD and plans to f/u PP- no meds during preg.  PE: BP 130/81   Pulse 91   Temp 98.6 F (37 C) (Oral)   Resp 18   Ht  (1.626 m)   Wt 101.6 kg (224 lb)   LMP 12/13/2015   BMI  38.45 kg/m  Gen: calm comfortable, NAD Resp: normal effort, no distress Abd: gravid  ROS, labs, PMH reviewed  Plan: Admit to [facility] cx ripening initially with cytotec followed by cervical foley/Pit prn CMP & P/C ratio pending due to some borderline BPs Tylenol for H/A per pt preference PCN for GBS ppx when in active labor/ROM Anticipate SVD  SHAW, KIMBERLY CNM 11/04/2016, 11:01 AM

## 2016-11-04 ENCOUNTER — Encounter (HOSPITAL_COMMUNITY): Payer: Self-pay

## 2016-11-04 ENCOUNTER — Inpatient Hospital Stay (HOSPITAL_COMMUNITY): Payer: Medicaid Other | Admitting: Anesthesiology

## 2016-11-04 ENCOUNTER — Inpatient Hospital Stay (HOSPITAL_COMMUNITY)
Admission: RE | Admit: 2016-11-04 | Discharge: 2016-11-07 | DRG: 774 | Disposition: A | Discharge status: Home / Self Care | Payer: Medicaid Other | Source: Ambulatory Visit | Attending: Family Medicine | Admitting: Family Medicine

## 2016-11-04 DIAGNOSIS — O480 Post-term pregnancy: Secondary | ICD-10-CM | POA: Diagnosis present

## 2016-11-04 DIAGNOSIS — O9952 Diseases of the respiratory system complicating childbirth: Secondary | ICD-10-CM | POA: Diagnosis present

## 2016-11-04 DIAGNOSIS — Z3A41 41 weeks gestation of pregnancy: Secondary | ICD-10-CM | POA: Diagnosis not present

## 2016-11-04 DIAGNOSIS — O1092 Unspecified pre-existing hypertension complicating childbirth: Secondary | ICD-10-CM | POA: Diagnosis present

## 2016-11-04 DIAGNOSIS — O99344 Other mental disorders complicating childbirth: Secondary | ICD-10-CM | POA: Diagnosis present

## 2016-11-04 DIAGNOSIS — O99824 Streptococcus B carrier state complicating childbirth: Secondary | ICD-10-CM | POA: Diagnosis present

## 2016-11-04 DIAGNOSIS — O99820 Streptococcus B carrier state complicating pregnancy: Secondary | ICD-10-CM

## 2016-11-04 DIAGNOSIS — F419 Anxiety disorder, unspecified: Secondary | ICD-10-CM | POA: Diagnosis present

## 2016-11-04 DIAGNOSIS — Z7722 Contact with and (suspected) exposure to environmental tobacco smoke (acute) (chronic): Secondary | ICD-10-CM | POA: Diagnosis present

## 2016-11-04 DIAGNOSIS — O99210 Obesity complicating pregnancy, unspecified trimester: Secondary | ICD-10-CM

## 2016-11-04 DIAGNOSIS — G932 Benign intracranial hypertension: Secondary | ICD-10-CM | POA: Diagnosis present

## 2016-11-04 DIAGNOSIS — R609 Edema, unspecified: Secondary | ICD-10-CM | POA: Diagnosis not present

## 2016-11-04 DIAGNOSIS — G43009 Migraine without aura, not intractable, without status migrainosus: Secondary | ICD-10-CM | POA: Diagnosis present

## 2016-11-04 LAB — COMPREHENSIVE METABOLIC PANEL
ALT: 9 U/L — ABNORMAL LOW (ref 14–54)
AST: 17 U/L (ref 15–41)
Albumin: 3.1 g/dL — ABNORMAL LOW (ref 3.5–5.0)
Alkaline Phosphatase: 165 U/L — ABNORMAL HIGH (ref 38–126)
Anion gap: 12 (ref 5–15)
BUN: 7 mg/dL (ref 6–20)
CO2: 18 mmol/L — ABNORMAL LOW (ref 22–32)
Calcium: 9.1 mg/dL (ref 8.9–10.3)
Chloride: 105 mmol/L (ref 101–111)
Creatinine, Ser: 0.59 mg/dL (ref 0.44–1.00)
GFR calc Af Amer: >60 mL/min (ref >60)
GFR calc non Af Amer: >60 mL/min (ref >60)
Glucose, Bld: 86 mg/dL (ref 65–99)
Potassium: 4 mmol/L (ref 3.5–5.1)
Sodium: 135 mmol/L (ref 135–145)
Total Bilirubin: 0.7 mg/dL (ref 0.3–1.2)
Total Protein: 6.4 g/dL — ABNORMAL LOW (ref 6.5–8.1)

## 2016-11-04 LAB — CBC
HCT: 34.4 % — ABNORMAL LOW (ref 36.0–46.0)
Hemoglobin: 11.5 g/dL — ABNORMAL LOW (ref 12.0–15.0)
MCH: 27.1 pg (ref 26.0–34.0)
MCHC: 33.4 g/dL (ref 30.0–36.0)
MCV: 81.1 fL (ref 78.0–100.0)
Platelets: 327 10*3/uL (ref 150–400)
RBC: 4.24 MIL/uL (ref 3.87–5.11)
RDW: 15.1 % (ref 11.5–15.5)
WBC: 11.3 10*3/uL — ABNORMAL HIGH (ref 4.0–10.5)

## 2016-11-04 LAB — PROTEIN / CREATININE RATIO, URINE
Creatinine, Urine: 41 mg/dL
Total Protein, Urine: <6 mg/dL

## 2016-11-04 LAB — TYPE AND SCREEN
ABO/RH(D): A POS
Antibody Screen: NEGATIVE

## 2016-11-04 MED ORDER — LIDOCAINE HCL (PF) 1 % IJ SOLN
INTRAMUSCULAR | Status: DC | PRN
  Administered 2016-11-04 (×2): 5 mL

## 2016-11-04 MED ORDER — SOD CITRATE-CITRIC ACID 500-334 MG/5ML PO SOLN: 30.0000 mL | ORAL | Status: DC | PRN

## 2016-11-04 MED ORDER — LACTATED RINGERS IV SOLN
INTRAVENOUS | Status: DC
  Administered 2016-11-04 (×2): via INTRAVENOUS
  Administered 2016-11-04: 1000 mL via INTRAVENOUS

## 2016-11-04 MED ORDER — LACTATED RINGERS IV SOLN: 500.0000 mL | INTRAVENOUS | Status: DC | PRN

## 2016-11-04 MED ORDER — LACTATED RINGERS IV SOLN: 500.0000 mL | Freq: Once | INTRAVENOUS | Status: DC

## 2016-11-04 MED ORDER — PENICILLIN G POT IN DEXTROSE 60000 UNIT/ML IV SOLN
3.0000 10*6.[IU] | INTRAVENOUS | Status: DC
  Filled 2016-11-04: qty 50

## 2016-11-04 MED ORDER — PHENYLEPHRINE 40 MCG/ML (10ML) SYRINGE FOR IV PUSH (FOR BLOOD PRESSURE SUPPORT)
80.0000 ug | PREFILLED_SYRINGE | INTRAVENOUS | Status: DC | PRN
  Administered 2016-11-05 (×2): 40 ug via INTRAVENOUS
  Filled 2016-11-04: qty 5

## 2016-11-04 MED ORDER — ONDANSETRON HCL 4 MG/2ML IJ SOLN
4.0000 mg | Freq: Four times a day (QID) | INTRAMUSCULAR | Status: DC | PRN
  Administered 2016-11-04 – 2016-11-05 (×2): 4 mg via INTRAVENOUS
  Filled 2016-11-04 (×2): qty 2

## 2016-11-04 MED ORDER — LACTATED RINGERS IV SOLN: INTRAVENOUS | Status: DC

## 2016-11-04 MED ORDER — ACETAMINOPHEN 325 MG PO TABS
650.0000 mg | ORAL_TABLET | ORAL | Status: DC | PRN
  Administered 2016-11-04: 650 mg via ORAL
  Filled 2016-11-04 (×2): qty 2

## 2016-11-04 MED ORDER — OXYTOCIN 40 UNITS IN LACTATED RINGERS INFUSION - SIMPLE MED
2.5000 [IU]/h | INTRAVENOUS | Status: DC
  Filled 2016-11-04: qty 1000

## 2016-11-04 MED ORDER — EPHEDRINE 5 MG/ML INJ
10.0000 mg | INTRAVENOUS | Status: DC | PRN
  Filled 2016-11-04: qty 2

## 2016-11-04 MED ORDER — OXYCODONE-ACETAMINOPHEN 5-325 MG PO TABS: 1.0000 | ORAL_TABLET | ORAL | Status: DC | PRN

## 2016-11-04 MED ORDER — PHENYLEPHRINE 40 MCG/ML (10ML) SYRINGE FOR IV PUSH (FOR BLOOD PRESSURE SUPPORT)
80.0000 ug | PREFILLED_SYRINGE | INTRAVENOUS | Status: DC | PRN
  Filled 2016-11-04: qty 5
  Filled 2016-11-04 (×2): qty 10

## 2016-11-04 MED ORDER — OXYCODONE-ACETAMINOPHEN 5-325 MG PO TABS: 2.0000 | ORAL_TABLET | ORAL | Status: DC | PRN

## 2016-11-04 MED ORDER — TERBUTALINE SULFATE 1 MG/ML IJ SOLN
0.2500 mg | Freq: Once | INTRAMUSCULAR | Status: DC | PRN
  Filled 2016-11-04: qty 1

## 2016-11-04 MED ORDER — FENTANYL CITRATE (PF) 100 MCG/2ML IJ SOLN
100.0000 ug | INTRAMUSCULAR | Status: DC | PRN
  Administered 2016-11-04 – 2016-11-05 (×3): 100 ug via INTRAVENOUS
  Filled 2016-11-04 (×4): qty 2

## 2016-11-04 MED ORDER — OXYTOCIN BOLUS FROM INFUSION
500.0000 mL | Freq: Once | INTRAVENOUS | Status: AC
  Administered 2016-11-05: 500 mL via INTRAVENOUS

## 2016-11-04 MED ORDER — MISOPROSTOL 25 MCG QUARTER TABLET
25.0000 ug | ORAL_TABLET | ORAL | Status: DC | PRN
  Administered 2016-11-04: 25 ug via VAGINAL
  Filled 2016-11-04 (×2): qty 1

## 2016-11-04 MED ORDER — DIPHENHYDRAMINE HCL 50 MG/ML IJ SOLN: 12.5000 mg | INTRAMUSCULAR | Status: DC | PRN

## 2016-11-04 MED ORDER — MISOPROSTOL 200 MCG PO TABS
50.0000 ug | ORAL_TABLET | ORAL | Status: DC | PRN
  Administered 2016-11-04: 50 ug via ORAL
  Filled 2016-11-04: qty 1

## 2016-11-04 MED ORDER — OXYTOCIN 40 UNITS IN LACTATED RINGERS INFUSION - SIMPLE MED
1.0000 m[IU]/min | INTRAVENOUS | Status: DC
  Administered 2016-11-05: 2 m[IU]/min via INTRAVENOUS

## 2016-11-04 MED ORDER — PENICILLIN G POTASSIUM 5000000 UNITS IJ SOLR
5.0000 10*6.[IU] | Freq: Once | INTRAVENOUS | Status: DC
  Filled 2016-11-04: qty 5

## 2016-11-04 MED ORDER — FENTANYL 2.5 MCG/ML BUPIVACAINE 1/10 % EPIDURAL INFUSION (WH - ANES)
14.0000 mL/h | INTRAMUSCULAR | Status: DC | PRN
  Administered 2016-11-04 – 2016-11-05 (×2): 14 mL/h via EPIDURAL
  Filled 2016-11-04 (×2): qty 100

## 2016-11-04 MED ORDER — SODIUM CHLORIDE 0.9 % IV SOLN
2.0000 g | Freq: Four times a day (QID) | INTRAVENOUS | Status: DC
  Administered 2016-11-04 – 2016-11-05 (×2): 2 g via INTRAVENOUS
  Filled 2016-11-04 (×5): qty 2000

## 2016-11-04 MED ORDER — LIDOCAINE HCL (PF) 1 % IJ SOLN
30.0000 mL | INTRAMUSCULAR | Status: AC | PRN
  Administered 2016-11-05: 30 mL via SUBCUTANEOUS
  Filled 2016-11-04 (×2): qty 30

## 2016-11-04 NOTE — Progress Notes (Signed)
Uncomfortable with contractions despite epidural. Having recurrent variable decels in setting of AROM. Will start amnioinfusion. Contractions have spaced out. Will also start pitocin.   SCE: 8/80/-2

## 2016-11-04 NOTE — Anesthesia Procedure Notes (Signed)
Epidural Patient location during procedure: OB  Staffing Anesthesiologist: Amaad Byers Performed: anesthesiologist   Preanesthetic Checklist Completed: patient identified, site marked, surgical consent, pre-op evaluation, timeout performed, IV checked, risks and benefits discussed and monitors and equipment checked  Epidural Patient position: sitting Prep: DuraPrep Patient monitoring: heart rate, continuous pulse ox and blood pressure Approach: right paramedian Location: L3-L4 Injection technique: LOR saline  Needle:  Needle type: Tuohy  Needle gauge: 17 G Needle length: 9 cm and 9 Needle insertion depth: 9 cm Catheter type: closed end flexible Catheter size: 20 Guage Catheter at skin depth: 14 cm Test dose: negative  Assessment Events: blood not aspirated, injection not painful, no injection resistance, negative IV test and no paresthesia  Additional Notes Patient identified. Risks/Benefits/Options discussed with patient including but not limited to bleeding, infection, nerve damage, paralysis, failed block, incomplete pain control, headache, blood pressure changes, nausea, vomiting, reactions to medication both or allergic, itching and postpartum back pain. Confirmed with bedside nurse the patient's most recent platelet count. Confirmed with patient that they are not currently taking any anticoagulation, have any bleeding history or any family history of bleeding disorders. Patient expressed understanding and wished to proceed. All questions were answered. Sterile technique was used throughout the entire procedure. Please see nursing notes for vital signs. Test dose was given through epidural needle and negative prior to continuing to dose epidural or start infusion. Warning signs of high block given to the patient including shortness of breath, tingling/numbness in hands, complete motor block, or any concerning symptoms with instructions to call for help. Patient was given  instructions on fall risk and not to get out of bed. All questions and concerns addressed with instructions to call with any issues.     

## 2016-11-04 NOTE — Progress Notes (Signed)
Patient ID: Madison Guzman, female   DOB: [birth year], 21 y.o.   MRN: [id number]  Feeling crampy since first cytotec  BPs 130/81, other VSS FHR 135-140, +LTV, +accels, occ variables Ctx irreg, q 2-3 at times Cx 1/thick/soft/-3/vtx  CMP     Component Value Date/Time   NA 135 11/04/2016 0735   NA 145 (H) 05/10/2015 1704   K 4.0 11/04/2016 0735   CL 105 11/04/2016 0735   CO2 18 (L) 11/04/2016 0735   GLUCOSE 86 11/04/2016 0735   BUN 7 11/04/2016 0735   BUN 6 05/10/2015 1704   CREATININE 0.59 11/04/2016 0735   CREATININE 0.77 09/22/2014 1056   CALCIUM 9.1 11/04/2016 0735   PROT 6.4 (L) 11/04/2016 0735   PROT 7.1 05/10/2015 1704   ALBUMIN 3.1 (L) 11/04/2016 0735   ALBUMIN 4.3 05/10/2015 1704   AST 17 11/04/2016 0735   ALT 9 (L) 11/04/2016 0735   ALKPHOS 165 (H) 11/04/2016 0735   BILITOT 0.7 11/04/2016 0735   BILITOT 0.2 05/10/2015 1704   GFRNONAA >60 11/04/2016 0735   GFRAA >60 11/04/2016 0735    IUP@term  Cx unfavorable  Rec placement of cervical foley- pt declines at present Second cytotec placed vaginally Reeval in 4hrs  [name] CNM 11/04/2016 1:18 PM

## 2016-11-04 NOTE — Anesthesia Pain Management Evaluation Note (Signed)
  CRNA Pain Management Visit Note  Patient: Madison Guzman, 21 y.o., female  "Hello I am a member of the anesthesia team at [facility]. We have an anesthesia team available at all times to provide care throughout the hospital, including epidural management and anesthesia for C-section. I don't know your plan for the delivery whether it a natural birth, water birth, IV sedation, nitrous supplementation, doula or epidural, but we want to meet your pain goals."   1.Was your pain managed to your expectations on prior hospitalizations?   No prior hospitalizations  2.What is your expectation for pain management during this hospitalization?     Epidural  3.How can we help you reach that goal? Epidural when desired.  Record the patient's initial score and the patient's pain goal.   Pain: 4  Pain Goal: 7 [facility] [place] wants you to be able to say your pain was always managed very well.  Leigha Olberding 11/04/2016

## 2016-11-04 NOTE — Anesthesia Preprocedure Evaluation (Signed)

## 2016-11-04 NOTE — Progress Notes (Addendum)
Patient ID: Madison Guzman, female   DOB: 09/07/1995, 21 y.o.   MRN: [id number]  Feeling more crampy after cytotec x 2  BP 125/61, other VSS FHR 145-150, +10 x 10 accels, some variables, +LTV Ctx irreg q 1-6 mins Cx 1/50/-2  P/C ratio: too low  IUP@term  Cx unfavorable GBS pos- PCN w/ active labor/ROM  Cervical foley placed without difficulty; will leave in until it comes out and then plan Pit  Anticipate SVD  [name] CNM 11/04/2016 5:51 PM

## 2016-11-04 NOTE — Progress Notes (Signed)
Pt states HA pain is unchanged, Refused med, normally has a HA. Not getting any worse.

## 2016-11-04 NOTE — Progress Notes (Signed)
Patient ID: Madison Guzman, female   DOB: 08/13/1995, 21 y.o.   MRN: [id number]  Uncomfortable;e and feeling pressure. Requesting epidural.   BP 125/66   Pulse 87   Temp 98.3 F (36.8 C) (Oral)   Resp 18   Ht  (1.626 m)   Wt 101.6 kg (224 lb)   LMP 12/13/2015   BMI 38.45 kg/m   FHR 145, +10 x 10 accels, mod variability, +variable decels Ctx 1-4  Dilation: 8 Dilation Complete Date: 11/04/16 Dilation Complete Time: 2030 Effacement (%): 80 Cervical Position: Posterior, Middle Station: -1 Presentation: Vertex Exam by:: Doroteo Glassman, MD  IUP@term . IOL for postdates. FB out and patient with lots of pressure AROM with large fluid return. Recheck of cervix as above.  GBS pos- start Ampicillin Will monitor contractions and augment with Pitocin as nedeed  Anticipate SVD  Caryl Ada, DO 11/04/2016 8:56 PM

## 2016-11-05 ENCOUNTER — Encounter (HOSPITAL_COMMUNITY): Payer: Self-pay

## 2016-11-05 DIAGNOSIS — O99824 Streptococcus B carrier state complicating childbirth: Secondary | ICD-10-CM

## 2016-11-05 DIAGNOSIS — O480 Post-term pregnancy: Secondary | ICD-10-CM

## 2016-11-05 DIAGNOSIS — Z3A41 41 weeks gestation of pregnancy: Secondary | ICD-10-CM

## 2016-11-05 LAB — RPR: RPR Ser Ql: NONREACTIVE

## 2016-11-05 MED ORDER — ONDANSETRON HCL 4 MG PO TABS: 4.0000 mg | ORAL_TABLET | ORAL | Status: DC | PRN

## 2016-11-05 MED ORDER — COCONUT OIL OIL: 1.0000 "application " | TOPICAL_OIL | Status: DC | PRN

## 2016-11-05 MED ORDER — BENZOCAINE-MENTHOL 20-0.5 % EX AERO
1.0000 "application " | INHALATION_SPRAY | CUTANEOUS | Status: DC | PRN
  Administered 2016-11-06: 1 via TOPICAL
  Filled 2016-11-05: qty 56

## 2016-11-05 MED ORDER — SENNOSIDES-DOCUSATE SODIUM 8.6-50 MG PO TABS
2.0000 | ORAL_TABLET | ORAL | Status: DC
  Administered 2016-11-05: 2 via ORAL
  Filled 2016-11-05: qty 2

## 2016-11-05 MED ORDER — SIMETHICONE 80 MG PO CHEW: 80.0000 mg | CHEWABLE_TABLET | ORAL | Status: DC | PRN

## 2016-11-05 MED ORDER — TETANUS-DIPHTH-ACELL PERTUSSIS 5-2.5-18.5 LF-MCG/0.5 IM SUSP: 0.5000 mL | Freq: Once | INTRAMUSCULAR | Status: DC

## 2016-11-05 MED ORDER — IBUPROFEN 600 MG PO TABS
600.0000 mg | ORAL_TABLET | Freq: Four times a day (QID) | ORAL | Status: DC
  Administered 2016-11-05 – 2016-11-07 (×7): 600 mg via ORAL
  Filled 2016-11-05 (×7): qty 1

## 2016-11-05 MED ORDER — DIBUCAINE 1 % RE OINT: 1.0000 "application " | TOPICAL_OINTMENT | RECTAL | Status: DC | PRN

## 2016-11-05 MED ORDER — DIPHENHYDRAMINE HCL 25 MG PO CAPS: 25.0000 mg | ORAL_CAPSULE | Freq: Four times a day (QID) | ORAL | Status: DC | PRN

## 2016-11-05 MED ORDER — ZOLPIDEM TARTRATE 5 MG PO TABS: 5.0000 mg | ORAL_TABLET | Freq: Every evening | ORAL | Status: DC | PRN

## 2016-11-05 MED ORDER — WITCH HAZEL-GLYCERIN EX PADS: 1.0000 "application " | MEDICATED_PAD | CUTANEOUS | Status: DC | PRN

## 2016-11-05 MED ORDER — ACETAMINOPHEN 325 MG PO TABS
650.0000 mg | ORAL_TABLET | ORAL | Status: DC | PRN
  Administered 2016-11-05 – 2016-11-06 (×3): 650 mg via ORAL
  Filled 2016-11-05 (×2): qty 2

## 2016-11-05 MED ORDER — LIDOCAINE-EPINEPHRINE (PF) 2 %-1:200000 IJ SOLN
INTRAMUSCULAR | Status: DC | PRN
  Administered 2016-11-05 (×2): 5 mL via INTRADERMAL

## 2016-11-05 MED ORDER — ONDANSETRON HCL 4 MG/2ML IJ SOLN: 4.0000 mg | INTRAMUSCULAR | Status: DC | PRN

## 2016-11-05 MED ORDER — PRENATAL MULTIVITAMIN CH
1.0000 | ORAL_TABLET | Freq: Every day | ORAL | Status: DC
  Administered 2016-11-06 – 2016-11-07 (×2): 1 via ORAL
  Filled 2016-11-05 (×2): qty 1

## 2016-11-05 NOTE — Progress Notes (Addendum)
Patient ID: Madison Guzman, female   DOB: October 30, 1995, 21 y.o.   MRN: [id number]  Patient feels a lot of pressure and pain with contractions. Patient sitting straight up in bed.  IUPC for variable decelerations FHT currently without decels, moderate variability. No accel. Cat 1. MVUs: 160-220.  Cervix: 5cm, although fetal position so elevated that can only feel 2cm of cervix on maternal left and no cervix on right. 80% effaced. Station still -2. Unable to feel sutures to determine position of baby.  Plan: Discussed with patient - try to get more comfortable with epidural. Start pitocin: MVUs 200-260. Peanut between legs and position changes from side to side. Recheck in 2-3 hrs. Discussed that if no improvement, other option would be c/s.  Levie Heritage, DO

## 2016-11-05 NOTE — Progress Notes (Signed)
I offered support to [place] and her parents and sister.  Shaquinta is feeling grateful that she got to see her baby, Lovina Reach, and that she felt her baby is doing "better."  She spoke about the fear after the delivery and the shock of it all.  She shared her birth story and her joy of being a mother.  I offered affirmations and spiritual support as well as a prayer for Lovina Reach, at pt's request.    She shared that [facility], FOB, has been between being with her and being with his grandfather who is receiving Hospice care and is actively dying.  He is coming over later tonight and I suggested she let him know ahead of time that he won't be able to hold his daughter yet due to her condition so that he knows what to expect.  She stated that although she lives about 30 minutes away, she will be up here all the time.  We will continue to check on her, but please also page as needs arise.  Chaplain Dyanne Carrel, Bcc Pager, [phone] 4:52 PM    11/05/16 1600  Clinical Encounter Type  Visited With Patient and family together  Visit Type Spiritual support  Referral From Social work  Spiritual Encounters  Spiritual Needs Prayer;Emotional

## 2016-11-05 NOTE — Anesthesia Postprocedure Evaluation (Signed)
Anesthesia Post Note  Patient: Lazarus Salines  Procedure(s) Performed: * No procedures listed *     Patient location during evaluation: Mother Baby Anesthesia Type: Epidural Level of consciousness: awake and alert and oriented Pain management: satisfactory to patient Vital Signs Assessment: post-procedure vital signs reviewed and stable Respiratory status: spontaneous breathing and nonlabored ventilation Cardiovascular status: stable Postop Assessment: no headache, no backache, no signs of nausea or vomiting, adequate PO intake and patient able to bend at knees (patient up walking) Anesthetic complications: no    Last Vitals:  Vitals:   11/05/16 1210 11/05/16 1420  BP: 131/79 129/73  Pulse: 97 (!) 101  Resp: 20 19  Temp: 37.6 C 36.9 C  SpO2:      Last Pain:  Vitals:   11/05/16 1420  TempSrc: Oral  PainSc: 3    Pain Goal:                 Safira Proffit

## 2016-11-05 NOTE — Progress Notes (Signed)
Patient arrived on MBU refusing to get in bed for postpartum check.  Desires to stay in wheelchair and be transported to NICU to see baby. Mom seens angry and uncooperative.  Baby taken to NICU via [facility] with assistance and patients mother.   Patients one hour postpartum check delayed due to mom being in NICU.  Patient came back from NICU in excellent spirits.  Baby doing "much better"  Per mom

## 2016-11-06 NOTE — Progress Notes (Signed)
Post Partum Day 1 Subjective: Patient very concerned about daughter's well-being in NICU (code APGAR with hypoxic encephalopathy). Otherwise, she has no complaints. Up ad lib, minimal abdominal discomfort and vaginal bleeding. Patient plans on breastfeeding if possible, POP for contraception. Patient had consult with chaplain yesterday.   Objective: Blood pressure 128/79, pulse 89, temperature 98 F (36.7 C), temperature source Oral, resp. rate 18, height  (1.626 m), weight 101.6 kg (224 lb), last menstrual period 12/13/2015, SpO2 99 %, unknown if currently breastfeeding.  Physical Exam:  General: alert, cooperative, appears stated age and mild distress Lochia: appropriate Uterine Fundus: firm Incision: N/A Abdomen: soft, mild tenderness to palpation DVT Evaluation: No evidence of DVT seen on physical exam. Negative Homan's sign. No cords or calf tenderness.   Recent Labs  11/04/16 0730  HGB 11.5*  HCT 34.4*    Assessment/Plan: Plan for discharge tomorrow  POP for contraception    LOS: 2 days   Dyke Maes Chanc Kervin, PA-S 11/06/2016, 7:26 AM

## 2016-11-06 NOTE — Lactation Note (Signed)
This note was copied from a baby's chart. Lactation Consultation Note  Patient Name: Madison Guzman [id number] Date: 11/06/2016 Reason for consult: Initial assessment;NICU baby;1st time breastfeeding;Primapara   Initial consult with first time mom of 74 hour old NICU infant. Mom reports she began pumping this morning. She has pumped twice and has not received any colostrum. Reviewed using Initiate setting. Providing milk for your infant in NICU Booklet given, reviewed pumping, colostrum, milk coming to volume, hand expression, what to expect with pumping and breast milk storage for the NICU infant.   Mom with large compressible breast and areola with large everted nipples. Mom reports her nipples were recently pierced, she did not have any hardware in the nipples. Mom was shown to hand express and able to return demonstration. She was pleased to see several gtts of colostrum. Reviewed storage of breast milk with mom and enc mom to take all EBM to NICU for infant. Infant currently NPO.   Enc mom to pump every 2-3 hours with a 4-5 hour at night with no pumping to rest. Enc mom to hand express post pumping. Mom voiced understanding to teaching.   BF Resources handout and LC Brochure given, mom informed of IP/OP Services, BF Support Groups and LC phone #. Mom has a Spectra 2 pump at home for use. She was informed of pumping rooms in NICU when she is d/c home. Enc her to pump after visiting infant.    Maternal Data Formula Feeding for Exclusion: No Has patient been taught Hand Expression?: Yes Does the patient have breastfeeding experience prior to this delivery?: No  Feeding    LATCH Score                   Interventions    Lactation Tools Discussed/Used Pump Review: Setup, frequency, and cleaning;Milk Storage Initiated by:: Reviewed and encouraged every 2-3 hours   Consult Status Consult Status: Follow-up Date: 11/07/16 Follow-up type: In-patient    Madison Guzman  Madison Guzman 11/06/2016, 5:27 PM

## 2016-11-07 ENCOUNTER — Ambulatory Visit (HOSPITAL_COMMUNITY)
Admit: 2016-11-07 | Discharge: 2016-11-07 | Disposition: A | Discharge status: Home / Self Care | Payer: Medicaid Other | Attending: Family Medicine | Admitting: Family Medicine

## 2016-11-07 DIAGNOSIS — R609 Edema, unspecified: Secondary | ICD-10-CM

## 2016-11-07 MED ORDER — HYDROCHLOROTHIAZIDE 25 MG PO TABS
25.0000 mg | ORAL_TABLET | Freq: Every day | ORAL | Status: DC
  Administered 2016-11-07: 25 mg via ORAL
  Filled 2016-11-07 (×2): qty 1

## 2016-11-07 MED ORDER — HYDROCHLOROTHIAZIDE 25 MG PO TABS: 25.0000 mg | ORAL_TABLET | Freq: Every day | ORAL | 0 refills | Status: DC

## 2016-11-07 MED ORDER — IBUPROFEN 600 MG PO TABS: 600.0000 mg | ORAL_TABLET | Freq: Four times a day (QID) | ORAL | 0 refills | Status: DC

## 2016-11-07 NOTE — Progress Notes (Signed)
*  PRELIMINARY RESULTS* Vascular Ultrasound Lower extremity venous duplex has been completed.  Preliminary findings: No evidence of DVT or baker's cyst.    Farrel Demark, RDMS, RVT  11/07/2016, 5:37 PM

## 2016-11-07 NOTE — Progress Notes (Signed)
CSW attempted to meet with MOB at MOB's bedside however MOB was not in her room (111).  CSW also went to the NICU in attempt to meet with MOB and MOB was not on the unit.   CSW will attempt to meet with MOB at a later time when MOB is visiting NICU.  Buren Havey Boyd-Gilyard, MSW, LCSW Clinical Social Work [phone]  

## 2016-11-07 NOTE — Clinical Social Work Maternal (Signed)
CLINICAL SOCIAL WORK MATERNAL/CHILD NOTE  Patient Details  Name: Madison Guzman MRN: [id number] Date of Birth: 06/07/1995  Date:  11/07/2016  Clinical Social Worker Initiating Note:  Sadey Yandell Boyd-Gilyard Date/Time: Initiated:  11/07/16/1522     Child's Name:  Madison Guzman   Biological Parents:  Mother, Father (FOB is Madison Guzman 08/18/1995)   Need for Interpreter:  None   Reason for Referral:  Behavioral Health Concerns   Address:  [address] [place] [address]    Phone number:  [phone] (home)     Additional phone number:   Household Members/Support Persons (HM/SP):    (MOB resides with MOB's parents and 2 younger brothers. )   HM/SP Name Relationship DOB or Age  HM/SP -1        HM/SP -2        HM/SP -3        HM/SP -4        HM/SP -5        HM/SP -6        HM/SP -7        HM/SP -8          Natural Supports (not living in the home):  Spouse/significant other, Immediate Family, Friends, Extended Family (FOB's family will also provide support.)   Professional Supports: Therapist (MOB has a therapist however does not recall therapist name. )   Employment: Student   Type of Work:     Education:  Some College   Homebound arranged:    Financial Resources:  Medicaid   Other Resources:  WIC, Food Stamps    Cultural/Religious Considerations Which May Impact Care:  Per MOB's Face Sheet, MOB is [employer].  Strengths:  Ability to meet basic needs , Understanding of illness, Home prepared for child    Psychotropic Medications:         Pediatrician:       Pediatrician List:   [place]    [place]    [place]    [place]    [place]    [place]      Pediatrician Fax Number:    Risk Factors/Current Problems:  Mental Health Concerns , Transportation    Cognitive State:  Able to Concentrate , Alert , Linear Thinking , Insightful , Goal Oriented    Mood/Affect:  Calm , Happy , Relaxed , Interested  , Comfortable    CSW Assessment: CSW me to complete an assessment for scoring a "17" on the EDPS and for NICU admission.  When CSW arrived, MOB was in bed resting and MOB's mother was on the couch watching TV.  MOB gave CSW permission to complete the assessment while MOB's mother was present.  Throughout the assessment MOB's demonstrated compassion and concern for MOB and infant.  MOB was polite, forthcoming, and receptive to meeting with CSW. CSW asked MOB about MOB's thoughts and feelings about infant's NICU admission.  MOB expressed "I have been on an emotional roller coaster; however, I am thankful my baby is doing better." MOB shared feelings of being afraid, sad, and self blame about MOB's delivery and NICU admission.  CSW validated and normalized MOB's thoughts and feelings. CSW provided education regarding Baby Blues vs PMADs and provided MOB with information about support groups held at [facility].  CSW encouraged MOB to evaluate her mental health throughout the postpartum period with the use of the New Mom Checklist developed by Postpartum Progress and notify a medical professional if symptoms arise.    CSW offered MOB resources for outpatient counseling and MOB declined.  MOB reported that MOB has a scheduled appointment with a therapist (name and agency is unknown).  CSW praised MOB for being proactive about MOB's mental health.  CSW assessed for safety and MOB denied SI, HI, and DV.  MOB did not present with any acute MH symptoms and appeared to have insight and awareness about her mental health needs.  CSW suggested MOB follow-with MOB's OB provider within 3-4 as oppose to 6 weeks due to MOB's EDPS score.   CSW assess for barriers for MOB to visit with infant and MOB communicated MOB did not have reliable transportation.  MOB's mother agreed to assist MOB as much as she can with MOB's transportation needs.  CSW offered gas cards for the family and MOB was appreciative (CSW left 2 gas cards at  infant's bedside).   CSW provided MOB with information to add infant on to MOB's Food Stamp, Medicaid, and WIC application.  MOB reports having all necessary items for infant and feelings prepared to parent.   CSW will continue to offer support and assess family for psychosocial needs while infant remains in NICU.   CSW Plan/Description:  Psychosocial Support and Ongoing Assessment of Needs, Perinatal Mood and Anxiety Disorder (PMADs) Education, Other Patient/Family Education, Other Information/Referral to Community Resources, Sudden Infant Death Syndrome (SIDS) Education, No Further Intervention Required/No Barriers to Discharge   Minette Manders Boyd-Gilyard, MSW, LCSW Clinical Social Work [phone]  Burl Tauzin D BOYD-GILYARD, LCSW 11/07/2016, 3:26 PM 

## 2016-11-07 NOTE — Progress Notes (Signed)
S: Examined patient for complaints of leg swelling and a tremor that was worse from her baseline tremor.  Patient says that her legs have increased in size during the last weeks of her pregnancy and in the postpartum period.    O: On exam, non-pitting edema present on bilateral thighs and legs, making them tender to palpation.  Tenderness primarily in anterior bilateral shins.  No cords palpated.   Patient also has a slight tremor primarily in her upper body.  Mental status is normal.  Cranial nerves are grossly normal.  A/P: The bilateral lower extremity edema is likely attributable to fluid overload from pregnancy as well as fluid administration during labor.  We will obtain bilateral ultrasounds to rule out DVT.  Prescribed HCTZ 25 mg daily to alleviate fluid buildup and patient discomfort after discharge.  Her tremor is likely due to her anxiety, especially since the birth experience and postpartum course was stressful for her since her baby unexpectedly had to go to the NICU.  She was advised to call her doctor if it became worse after discharge.

## 2016-11-07 NOTE — Discharge Summary (Signed)
OB Discharge Summary     Patient Name: Madison Guzman DOB: 18-May-1995 MRN: [id number]  Date of admission: 11/04/2016 Delivering MD: Pincus Large   Date of discharge: 11/07/2016  Admitting diagnosis: INDUCTION Intrauterine pregnancy: [redacted]w[redacted]d     Secondary diagnosis:  Active Problems:   Post term pregnancy at [redacted] weeks gestation   SVD (spontaneous vaginal delivery)  Additional problems: anxiety, pseudotumor cerebri, baby with code APGAR, in NICU     Discharge diagnosis: Term Pregnancy Delivered                                                                                                Post partum procedures:none  Augmentation: AROM, Pitocin, Cytotec and Foley Balloon  Complications: None  Hospital course:  Induction of Labor With Vaginal Delivery   21 y.o. yo G1P1001 at [redacted]w[redacted]d was admitted to the hospital 11/04/2016 for induction of labor.  Indication for induction: Postdates.  Patient had an uncomplicated labor course as follows: Membrane Rupture Time/Date: 8:38 PM ,11/04/2016   Intrapartum Procedures: Episiotomy: None [1]                                         Lacerations:  Sulcus [9];Labial [10]  Patient had delivery of a Viable infant.  Information for the patient's newborn:  Madison, Guzman [[id number]]      11/05/2016  Details of delivery can be found in separate delivery note.  Patient had a routine postpartum course. Patient is discharged home 11/07/16.  Physical exam  Vitals:   11/06/16 0230 11/06/16 0538 11/06/16 1836 11/07/16 0528  BP: 126/73 128/79 130/73 119/65  Pulse: 81 89 71 82  Resp: Temp:  98 F (36.7 C) 98.5 F (36.9 C)   TempSrc:  Oral Oral   SpO2:   100%   Weight:      Height:       General: alert, cooperative and no distress, but worried and tearful Lochia: appropriate Uterine Fundus: firm Incision: N/A DVT Evaluation: No evidence of DVT seen on physical exam. No cords or calf tenderness. Calf/Ankle edema is present  bilaterally, up to thighs Labs: Lab Results  Component Value Date   WBC 11.3 (H) 11/04/2016   HGB 11.5 (L) 11/04/2016   HCT 34.4 (L) 11/04/2016   MCV 81.1 11/04/2016   PLT 327 11/04/2016   CMP Latest Ref Rng & Units 11/04/2016  Glucose 65 - 99 mg/dL 86  BUN 6 - 20 mg/dL 7  Creatinine 2.53 - 6.64 mg/dL 4.03  Sodium 474 - 259 mmol/L 135  Potassium 3.5 - 5.1 mmol/L 4.0  Chloride 101 - 111 mmol/L 105  CO2 22 - 32 mmol/L 18(L)  Calcium 8.9 - 10.3 mg/dL 9.1  Total Protein 6.5 - 8.1 g/dL 6.4(L)  Total Bilirubin 0.3 - 1.2 mg/dL 0.7  Alkaline Phos 38 - 126 U/L 165(H)  AST 15 - 41 U/L 17  ALT 14 - 54 U/L 9(L)    Discharge instruction: per After  Visit Summary and "Baby and Me Booklet".  After visit meds:  Allergies as of 11/07/2016   No Known Allergies     Medication List    TAKE these medications   albuterol 108 (90 Base) MCG/ACT inhaler Commonly known as:  PROVENTIL HFA;VENTOLIN HFA Inhale 2 puffs by mouth every 4 hours as needed to treat wheezing   ibuprofen 600 MG tablet Commonly known as:  ADVIL,MOTRIN Take 1 tablet (600 mg total) by mouth every 6 (six) hours.   OVER THE COUNTER MEDICATION Take 2 tablets by mouth daily.   TYLENOL 325 MG tablet Generic drug:  acetaminophen Take 650 mg by mouth every 6 (six) hours as needed.            Discharge Care Instructions        Start     Ordered   11/07/16 0000  ibuprofen (ADVIL,MOTRIN) 600 MG tablet  Every 6 hours     11/07/16 1148   11/04/16 0000  OB RESULT CONSOLE Group B Strep    Comments:  This external order was created through the Results Console.   11/04/16 0858   11/04/16 0000  OB RESULTS CONSOLE GC/Chlamydia    Comments:  This external order was created through the Results Console.   11/04/16 0858      Diet: routine diet  Activity: Advance as tolerated. Pelvic rest for 6 weeks.   Outpatient follow up:4 weeks Follow up Appt:Future Appointments Date Time Provider Department Center  12/09/2016 2:30  PM Lesly Dukes, MD [facility]   Follow up Visit:No Follow-up on file.  Postpartum contraception: Undecided  Newborn Data: Live born female  Birth Weight: 7 lb 7.9 oz (3400 g) APGAR: 0, 2  Baby Feeding: Breast Disposition:NICU   11/07/2016 Lennox Solders, MD  CNM attestation I have seen and examined this patient and agree with above documentation in the resident's note.   Madison Guzman is a 21 y.o. G1P1001 s/p SVD.   Pain is well controlled.  Plan for birth control is undecided.  Method of Feeding: breast  PE:  BP 119/65 (BP Location: Left Arm)   Pulse 82   Temp 98.5 F (36.9 C) (Oral)   Resp 16   Ht  (1.626 m)   Wt 101.6 kg (224 lb)   LMP 12/13/2015   SpO2 100%   Breastfeeding? Unknown   BMI 38.45 kg/m  Fundus firm  No results for input(s): HGB, HCT in the last 72 hours.   Plan: discharge today - postpartum care discussed - f/u clinic in 4 weeks for postpartum visit   [name], CNM 1:09 PM 11/07/2016

## 2016-11-07 NOTE — Progress Notes (Signed)
CSW attempted to meet with MOB however, MOB was in route to the NICU.  MOB will call CSW when MOB returns to her room.  Blaine Hamper, MSW, LCSW Clinical Social Work [phone]

## 2016-11-07 NOTE — Progress Notes (Signed)
Verified with Dr Frances Furbish to discharge patient and send to [facility] for outpatient [place] of leg (DVT).  Discharge instructions completed and instructed patient to go to [facility] admitting to check in for [place].

## 2016-11-12 ENCOUNTER — Ambulatory Visit: Payer: Self-pay

## 2016-11-12 NOTE — Lactation Note (Signed)
This note was copied from a baby's chart. Lactation Consultation Note  Patient Name: Madison Guzman [id number] Date: 11/12/2016 Reason for consult: Follow-up assessment;NICU baby;1st time breastfeeding;Primapara;Term   RN called and requested consult for P1 NICU mom, first experience and baby was latching for first time.  Consult time for 3p. LC arrived at 1515 and infant was on right breast in cradle hold but was having difficulty staying latched. [facility] taught mom how to use cross-cradle hold with sandwiching breast and providing breast support.   Infant latched and stayed on breast in a more consistent but slow rhythmical sucking pattern.   Infant fed for 30 minutes.  Mom is currently using an [employer] DEBP at the hotel she is staying.  Reports she has a Spectra at home but has not used it yet. Mom reports "not pumping enough" times during the day (did not give a specific # of times), but is getting ~90-120 ml with pumping sessions.   LC encouraged mom to try to pump about every 2 hrs during the day and at least once during the night for a total of 8+ pumpings per day to maintain milk supply.   LC encouraged mom that this was a very good first feeding for infant and that the more she breastfeeds infant the more the infant will learn and it will get easier to feed. Discussed what breastfeeding/ pumping/ and supplementing after breastfeeding may "look" like when infant is discharged to home.  Discussed strategies to help maintain milk supply and transition infant over to exclusive direct breastfeedings.      Feeding Feeding Type: Breast Fed Length of feed: 30 min  LATCH Score Latch: Repeated attempts needed to sustain latch, nipple held in mouth throughout feeding, stimulation needed to elicit sucking reflex.  Audible Swallowing: A few with stimulation  Type of Nipple: Everted at rest and after stimulation  Comfort (Breast/Nipple): Soft / non-tender  Hold (Positioning): Assistance needed  to correctly position infant at breast and maintain latch.  LATCH Score: 7  Interventions Interventions: Breast feeding basics reviewed;Assisted with latch;Skin to skin;Hand express;Breast compression;Expressed milk;Support pillows    Consult Status Consult Status: PRN    Lendon Ka 11/12/2016, 6:15 PM

## 2016-11-14 ENCOUNTER — Ambulatory Visit: Payer: Self-pay

## 2016-11-14 NOTE — Lactation Note (Signed)
This note was copied from a baby's chart. Lactation Consultation Note  Patient Name: Madison Guzman [id number] Date: 11/14/2016 Reason for consult: Follow-up assessment   With this term NICU baby, now 47 days old, and post cooling blanket. Mom has a good milk supply, but baby has not latched yet. [name] is used to bottle feeding. She was being gavaged fed when I entered the room. I assisted mom with latching the baby in cross cradle hold, but no suckles. I then added 1 mls of EBm into the shield, and the baby choked briefly, was orally bulb syringed for milk, . The baby's oxygen saturations dropped into the low 90's, and was quickly up to 100 %. Mom very tearful - scared, sad. I told her I was sorry the baby would not latch, but latching and breastfeedig could be worked on after baby goes home.    Maternal Data    Feeding Feeding Type: Breast Fed Length of feed: 30 min  LATCH Score Latch: Too sleepy or reluctant, no latch achieved, no sucking elicited. (latached to 24 nipple shiled, few or no suckles noted)  Audible Swallowing: None  Type of Nipple: Everted at rest and after stimulation  Comfort (Breast/Nipple): Soft / non-tender  Hold (Positioning): Assistance needed to correctly position infant at breast and maintain latch.  LATCH Score: 5  Interventions Interventions: Breast feeding basics reviewed;Assisted with latch;Skin to skin;Hand express;Adjust position;Support pillows;Position options;Expressed milk  Lactation Tools Discussed/Used     Consult Status Consult Status: PRN Follow-up type: In-patient (NICU)    Alfred Levins 11/14/2016, 3:40 PM

## 2016-11-23 ENCOUNTER — Inpatient Hospital Stay (HOSPITAL_COMMUNITY)
Admission: AD | Admit: 2016-11-23 | Discharge: 2016-11-23 | Disposition: A | Discharge status: Home / Self Care | Payer: Medicaid Other | Source: Ambulatory Visit | Attending: Obstetrics & Gynecology | Admitting: Obstetrics & Gynecology

## 2016-11-23 DIAGNOSIS — Z8249 Family history of ischemic heart disease and other diseases of the circulatory system: Secondary | ICD-10-CM | POA: Insufficient documentation

## 2016-11-23 DIAGNOSIS — J029 Acute pharyngitis, unspecified: Secondary | ICD-10-CM | POA: Insufficient documentation

## 2016-11-23 DIAGNOSIS — Z791 Long term (current) use of non-steroidal anti-inflammatories (NSAID): Secondary | ICD-10-CM | POA: Diagnosis not present

## 2016-11-23 DIAGNOSIS — Z825 Family history of asthma and other chronic lower respiratory diseases: Secondary | ICD-10-CM | POA: Diagnosis not present

## 2016-11-23 DIAGNOSIS — F4321 Adjustment disorder with depressed mood: Secondary | ICD-10-CM | POA: Insufficient documentation

## 2016-11-23 DIAGNOSIS — Z818 Family history of other mental and behavioral disorders: Secondary | ICD-10-CM | POA: Insufficient documentation

## 2016-11-23 DIAGNOSIS — R52 Pain, unspecified: Secondary | ICD-10-CM

## 2016-11-23 DIAGNOSIS — Z9889 Other specified postprocedural states: Secondary | ICD-10-CM | POA: Insufficient documentation

## 2016-11-23 DIAGNOSIS — G43909 Migraine, unspecified, not intractable, without status migrainosus: Secondary | ICD-10-CM | POA: Diagnosis not present

## 2016-11-23 DIAGNOSIS — Z7722 Contact with and (suspected) exposure to environmental tobacco smoke (acute) (chronic): Secondary | ICD-10-CM | POA: Insufficient documentation

## 2016-11-23 DIAGNOSIS — R1030 Lower abdominal pain, unspecified: Secondary | ICD-10-CM

## 2016-11-23 DIAGNOSIS — Z833 Family history of diabetes mellitus: Secondary | ICD-10-CM | POA: Diagnosis not present

## 2016-11-23 DIAGNOSIS — Z79899 Other long term (current) drug therapy: Secondary | ICD-10-CM | POA: Diagnosis not present

## 2016-11-23 DIAGNOSIS — R109 Unspecified abdominal pain: Secondary | ICD-10-CM | POA: Diagnosis not present

## 2016-11-23 DIAGNOSIS — O9089 Other complications of the puerperium, not elsewhere classified: Secondary | ICD-10-CM | POA: Insufficient documentation

## 2016-11-23 DIAGNOSIS — R102 Pelvic and perineal pain: Secondary | ICD-10-CM | POA: Insufficient documentation

## 2016-11-23 DIAGNOSIS — J45909 Unspecified asthma, uncomplicated: Secondary | ICD-10-CM | POA: Diagnosis not present

## 2016-11-23 LAB — URINALYSIS, ROUTINE W REFLEX MICROSCOPIC
Glucose, UA: NEGATIVE mg/dL
Ketones, ur: NEGATIVE mg/dL
Nitrite: NEGATIVE
Protein, ur: 100 mg/dL — AB
Specific Gravity, Urine: 1.035 — ABNORMAL HIGH (ref 1.005–1.030)
pH: 5 (ref 5.0–8.0)

## 2016-11-23 MED ORDER — OXYCODONE-ACETAMINOPHEN 5-325 MG PO TABS: 1.0000 | ORAL_TABLET | ORAL | 0 refills | Status: DC | PRN

## 2016-11-23 MED ORDER — IBUPROFEN 600 MG PO TABS: 600.0000 mg | ORAL_TABLET | Freq: Four times a day (QID) | ORAL | 1 refills | Status: DC | PRN

## 2016-11-23 NOTE — Discharge Instructions (Signed)
Disposable Sitz Bath °A disposable sitz bath is a plastic basin that fits over the toilet. A bag is hung above the toilet, and the bag is connected to a tube that opens into the basin. The bag is filled with warm water that flows into the basin through the tube. A sitz bath can be used to help relieve symptoms, clean, and promote healing in the genital and anal areas, as well as in the lower abdomen and buttocks. °What are the risks? °Sitz baths are generally very safe. It is possible for the skin between the genitals and the anus (perineum) to become infected, but this is rare. You can avoid this by cleaning your sitz bath supplies thoroughly. °How to use a disposable sitz bath °1. Close the clamp on the tube. Make sure the clamp is closed tightly to prevent leakage. °2. Fill the sitz bath basin and the plastic bag with warm water. The water should be warm enough to be comfortable, but not hot. °3. Raise the toilet seat and place the filled basin on the toilet. Make sure the overflow opening is facing toward the back of the toilet. °? If you prefer, you may place the empty basin on the toilet first, and then use the plastic bag to fill the basin with warm water. °4. Hang the filled plastic bag overhead on a hook or towel rack close to the toilet. The bag should be higher than the toilet so that the water will flow down through the tube. °5. Attach the tube to the opening on the basin. Make sure that the tube is attached to the basin tightly to prevent leakage. °6. Sit on the basin and release the clamp. This will allow warm water to flow into the basin and flush the area around your genitals and anus. °7. Remain sitting on the basin for about 15-20 minutes, or as long as told by your health care provider. °8. Stand up and gently pat your skin dry. If directed, apply clean bandages (dressings) to the affected area as told by your health care provider. °9. Carefully remove the basin from the toilet seat and tip the  basin into the toilet to empty any remaining water. Empty any remaining water from the plastic bag into the toilet. Then, flush the toilet. °10. Wash the basin with warm water and soap. Let the basin air dry in the sink. You should also let the plastic bag and the tubing air dry. °11. Store the basin, tubing, and plastic bag in a clean, dry area. °12. Wash your hands with soap and water. If soap and water are not available, use hand sanitizer. °Contact a health care provider if: °· You have symptoms that get worse instead of better. °· You develop new skin irritation, redness, or swelling around your genitals or anus. °This information is not intended to replace advice given to you by your health care provider. Make sure you discuss any questions you have with your health care provider. °Document Released: 07/29/2011 Document Revised: 07/05/2015 Document Reviewed: 12/17/2014 °Elsevier Interactive Patient Education © 2018 [employer] ° °

## 2016-11-23 NOTE — MAU Provider Note (Signed)
History     CSN: [id number]  Arrival date and time: 11/23/16 1729   First Provider Initiated Contact with Patient 11/23/16 1800      Chief Complaint  Patient presents with  . Abdominal Pain   Madison Guzman is a 21 y.o. G1P1001 at 18 days postpartum SVD presenting with one week history of worsening vaginal pain and malodorous clear discharge. She also has 1 day history of lower abdominal pain. No fever or chills. She had bilateral labial and vaginal sulcus lacerations repaired. Lochia stopped about a week ago. Not sexually active. Baby still in NICU but improving and she is stressed/depressed about that.  Pumping breasts without difficulty. Ran out of ibuprofen.      OB History  Gravida Para Term Preterm AB Living  0 0 1  SAB TAB Ectopic Multiple Live Births  0 0 0 0 1    # Outcome Date GA Lbr Len/2nd Weight Sex Delivery Anes PTL Lv  1 Term 11/05/16 [redacted]w[redacted]d 14:00 / 01:18 7 lb 7.9 oz (3.4 kg) F Vag-Spont EPI  LIV       Past Medical History:  Diagnosis Date  . Asthma   . Depression   . Migraine    Pt reports since age of 54  . Pharyngitis 01/08/2013   [facility] - strep neg    Past Surgical History:  Procedure Laterality Date  . TYMPANOSTOMY TUBE PLACEMENT Bilateral     Family History  Problem Relation Age of Onset  . Asthma Mother   . Hypertension Mother   . Diabetes Maternal Grandmother   . Hyperlipidemia Maternal Grandmother   . Hypertension Maternal Grandmother   . Mental illness Maternal Grandmother   . Migraines Maternal Grandmother   . Seizures Father   . ADD / ADHD Brother        1 Younger brother has ADHD  . Mental illness Maternal Aunt        Bipolar  . ADD / ADHD Maternal Uncle     Social History  Substance Use Topics  . Smoking status: Passive Smoke Exposure - Never Smoker  . Smokeless tobacco: Never Used  . Alcohol use No    Allergies: No Known Allergies  Prescriptions Prior to Admission  Medication Sig Dispense Refill  Last Dose  . acetaminophen (TYLENOL) 325 MG tablet Take 650 mg by mouth every 6 (six) hours as needed.   Past Week at Unknown time  . albuterol (PROVENTIL HFA;VENTOLIN HFA) 108 (90 BASE) MCG/ACT inhaler Inhale 2 puffs by mouth every 4 hours as needed to treat wheezing 2 Inhaler 1 rescue  . hydrochlorothiazide (HYDRODIURIL) 25 MG tablet Take 1 tablet (25 mg total) by mouth daily. 30 tablet 0   . ibuprofen (ADVIL,MOTRIN) 600 MG tablet Take 1 tablet (600 mg total) by mouth every 6 (six) hours. 30 tablet 0   . OVER THE COUNTER MEDICATION Take 2 tablets by mouth daily.   Past Week at Unknown time    Review of Systems  Constitutional: Positive for fatigue. Negative for chills and fever.  Gastrointestinal: Positive for abdominal pain and vomiting. Negative for diarrhea.  Genitourinary: Positive for pelvic pain, vaginal bleeding, vaginal discharge and vaginal pain. Negative for flank pain.  Musculoskeletal: Negative for back pain.  Psychiatric/Behavioral: The patient is nervous/anxious.    Physical Exam   Blood pressure (!) 127/92, pulse 100, temperature 98.1 F (36.7 C), resp. rate 18, height  (1.626 m), weight 197 lb 0.6 oz (89.4 kg),  unknown if currently breastfeeding. Patient Vitals for the past 24 hrs:  BP Temp Pulse Resp Height Weight  11/23/16 1810 121/85 - 72 - - -  11/23/16 1747 (!) 127/92 98.1 F (36.7 C) 100 18 - -  11/23/16 1739 - - - -  (1.626 m) 197 lb 0.6 oz (89.4 kg)   Physical Exam  Nursing note and vitals reviewed. Constitutional: She is oriented to person, place, and time. She appears well-developed and well-nourished. No distress.  HENT:  Head: Normocephalic.  Eyes: No scleral icterus.  Neck: Neck supple.  Cardiovascular: Normal rate.   Respiratory: Effort normal.  GI: Soft. There is tenderness.  Mild to moderate suprapubic tenderness. Uterus well involuted and no fundal tenderness noted  Genitourinary: Vaginal discharge found.  Genitourinary Comments:  External genitalia appears normal. Left labia majora tender. Blood tinged milky appearing vaginal discharge with slight malodor.  Vaginal suture material noted. No defect or hematoma palpabe in vagina. No CMT.   Musculoskeletal: Normal range of motion.  Neurological: She is alert and oriented to person, place, and time.  Skin: Skin is warm and dry.  Psychiatric: Her behavior is normal.  Teary at times    MAU Course  Procedures Results for orders placed or performed during the hospital encounter of 11/23/16 (from the past 24 hour(s))  Urinalysis, Routine w reflex microscopic     Status: Abnormal   Collection Time: 11/23/16  5:37 PM  Result Value Ref Range   Color, Urine AMBER (A) YELLOW   APPearance CLOUDY (A) CLEAR   Specific Gravity, Urine 1.035 (H) 1.005 - 1.030   pH 5.0 5.0 - 8.0   Glucose, UA NEGATIVE NEGATIVE mg/dL   Hgb urine dipstick MODERATE (A) NEGATIVE   Bilirubin Urine SMALL (A) NEGATIVE   Ketones, ur NEGATIVE NEGATIVE mg/dL   Protein, ur 161 (A) NEGATIVE mg/dL   Nitrite NEGATIVE NEGATIVE   Leukocytes, UA LARGE (A) NEGATIVE   RBC / HPF TOO NUMEROUS TO COUNT 0 - 5 RBC/hpf   WBC, UA TOO NUMEROUS TO COUNT 0 - 5 WBC/hpf   Bacteria, UA RARE (A) NONE SEEN   Squamous Epithelial / LPF 6-30 (A) NONE SEEN   Mucus PRESENT   Urine culture sent  MDM Afebrile and no fundal tenderness so endometritis can be ruled out.   C/W Dr. Debroah Loop who saw patient. She declines another vaginal exam. Will discharge home with reassurance and instructions to use analgesics, local cold packs and Sitz baths.  Assessment and Plan  18 days postpartum 1. Pain related to vaginal delivery   2. Situational depression   3. Lower abdominal pain    Allergies as of 11/23/2016   No Known Allergies     Medication List    STOP taking these medications   hydrochlorothiazide 25 MG tablet Commonly known as:  HYDRODIURIL     TAKE these medications   albuterol 108 (90 Base) MCG/ACT inhaler Commonly  known as:  PROVENTIL HFA;VENTOLIN HFA Inhale 2 puffs by mouth every 4 hours as needed to treat wheezing   ibuprofen 600 MG tablet Commonly known as:  ADVIL,MOTRIN Take 1 tablet (600 mg total) by mouth every 6 (six) hours as needed. What changed:  when to take this  reasons to take this   OVER THE COUNTER MEDICATION Take 2 tablets by mouth daily.   oxyCODONE-acetaminophen 5-325 MG tablet Commonly known as:  PERCOCET/ROXICET Take 1 tablet by mouth every 4 (four) hours as needed.   TYLENOL 325 MG tablet Generic drug:  acetaminophen Take 650 mg by mouth every 6 (six) hours as needed.      Follow-up Information    [facility] Follow up on 12/17/2018.   Specialty:  Obstetrics and Gynecology Contact information: [address] [place] [address] [phone]          Joselle Deeds CNM 11/23/2016, 6:03 PM

## 2016-11-23 NOTE — MAU Note (Signed)
Patient presents with being postpartum 18 days, baby in the NICU, thinks has uterine infection. About 1 week after baby born vaginal pain came back much worse, shooting pains in vagina, started having lower abdominal pain, foul smelling discharge. Denies fever.

## 2016-11-25 LAB — URINE CULTURE

## 2016-12-09 ENCOUNTER — Encounter: Payer: Self-pay | Admitting: Obstetrics & Gynecology

## 2016-12-09 ENCOUNTER — Ambulatory Visit (INDEPENDENT_AMBULATORY_CARE_PROVIDER_SITE_OTHER): Payer: Medicaid Other | Admitting: Obstetrics & Gynecology

## 2016-12-09 DIAGNOSIS — Z1389 Encounter for screening for other disorder: Secondary | ICD-10-CM | POA: Diagnosis not present

## 2016-12-09 NOTE — Progress Notes (Signed)
Post Partum Exam  Lazarus Salinesnniya M Moomaw is a 21 y.o. 751P1001 female who presents for a postpartum visit. She is 5 weeks postpartum following a spontaneous vaginal delivery. I have fully reviewed the prenatal and intrapartum course. The delivery was at 41 gestational weeks.  Anesthesia: epidural. Postpartum course has been stressful due to pain. Baby's course has been eventful due to NICU stay. Baby is feeding by bottle-breast milk. Bleeding no bleeding. Bowel function is normal. Bladder function is normal. Patient is not sexually active. Contraception method is none. Postpartum depression screening:neg (score 7)  The following portions of the patient's history were reviewed and updated as appropriate: allergies, current medications, past family history, past medical history, past social history, past surgical history and problem list.  Review of Systems Pertinent items noted in HPI and remainder of comprehensive ROS otherwise negative.    Objective:  Weight 202 lb (91.6 kg), unknown if currently breastfeeding.   Pt refused all physical exam.  She is feeding infant.  She appears well and adjusted.  Mother is with her.  Concurs she is doing well.       Pt is weldomce to come back anytime for exam is she wishes.                                 Assessment:    Post partum state.  Plan:   1. None-per patient preference 2. Pt again informed she is welcome to come back for exam.  3. Follow up in: 1 year or as needed.

## 2016-12-18 ENCOUNTER — Telehealth: Payer: Self-pay | Admitting: *Deleted

## 2016-12-18 MED ORDER — FLUCONAZOLE 150 MG PO TABS: 150.0000 mg | ORAL_TABLET | Freq: Once | ORAL | 0 refills | Status: AC

## 2016-12-18 NOTE — Telephone Encounter (Signed)
Pt called stating that her newborn has been treated for thrush while in NICU.  She states that it is not getting any better and thinks she may have yeast on her nipples since she is pumping.  RX for Diflucan sent to her pharmacy.  Pt has spoken with the pediatrician.

## 2017-01-08 DIAGNOSIS — K8020 Calculus of gallbladder without cholecystitis without obstruction: Secondary | ICD-10-CM | POA: Diagnosis not present

## 2017-03-30 ENCOUNTER — Encounter: Payer: Self-pay | Admitting: Advanced Practice Midwife

## 2017-03-30 ENCOUNTER — Ambulatory Visit (INDEPENDENT_AMBULATORY_CARE_PROVIDER_SITE_OTHER): Payer: Medicaid Other | Admitting: Advanced Practice Midwife

## 2017-03-30 VITALS — BP 133/82 | HR 93 | Ht 64.0 in | Wt 234.0 lb

## 2017-03-30 DIAGNOSIS — N6311 Unspecified lump in the right breast, upper outer quadrant: Secondary | ICD-10-CM

## 2017-03-30 NOTE — Patient Instructions (Signed)
Breast Cyst A breast cyst is a sac in the breast that is filled with fluid. Breast cysts are usually noncancerous (benign). They are common among women, and they are most often located in the upper, outer portion of the breast. One or more cysts may develop. They form when fluid builds up inside of the breast glands. There are several types of breast cysts:  Macrocyst. This is a cyst that is about 2 inches (5.1 cm) across (in diameter).  Microcyst. This is a very small cyst that you cannot feel, but it can be seen with imaging tests such as an X-ray of the breast (mammogram) or ultrasound.  Galactocele. This is a cyst that contains milk. It may develop if you suddenly stop breastfeeding.  Breast cysts do not increase your risk of breast cancer. They usually disappear after menopause, unless you take artificial hormones (are on hormone therapy). What are the causes? The exact cause of breast cysts is not known. Possible causes include:  Blockage of tubes (ducts) in the breast glands, which leads to fluid buildup. Duct blockage may result from: ? Fibrocystic breast changes. This is a common, benign condition that occurs when women go through hormonal changes during the menstrual cycle. This is a common cause of multiple breast cysts. ? Overgrowth of breast tissue or breast glands. ? Scar tissue in the breast from previous surgery.  Changes in certain female hormones (estrogen and progesterone).  What increases the risk? You may be more likely to develop breast cysts if you have not gone through menopause. What are the signs or symptoms? Symptoms of a breast cyst may include:  Feeling one or more smooth, round, soft lumps (like grapes) in the breast that are easily moveable. The lump(s) may get bigger and more painful before your period and get smaller after your period.  Breast discomfort or pain.  How is this diagnosed? A cyst can be felt during a physical exam by your health care  provider. A mammogram and ultrasound will be done to confirm the diagnosis. Fluid may be removed from the cyst with a needle (fine-needle aspiration) and tested to make sure the cyst is not cancerous. How is this treated? Treatment may not be necessary. Your health care provider may monitor the cyst to see if it goes away on its own. If the cyst is uncomfortable or gets bigger, or if you do not like how the cyst makes your breast look, you may need treatment. Treatment may include:  Hormone treatment.  Fine-needle aspiration, to drain fluid from the cyst. There is a chance of the cyst coming back (recurring) after aspiration.  Surgery to remove the cyst.  Follow these instructions at home:  See your health care provider regularly. ? Get a yearly physical exam. ? If you are 20-40 years old, get a clinical breast exam every 1-3 years. After age 40, get this exam every year. ? Get mammograms as often as directed.  Do a breast self-exam every month, or as often as directed. Having many breast cysts, or "lumpy" breasts, may make it harder to feel for new lumps. Understand how your breasts normally look and feel, and write down any changes in your breasts so you can tell your health care provider about the changes. A breast self-exam involves: ? Comparing your breasts in the mirror. ? Looking for visible changes in your skin or nipples. ? Feeling for lumps or changes.  Take over-the-counter and prescription medicines only as told by your health care   provider.  Wear a supportive bra, especially when exercising.  Follow instructions from your health care provider about eating and drinking restrictions. ? Avoid caffeine. ? Cut down on salt (sodium) in what you eat and drink, especially before your menstrual period. Too much sodium can cause fluid buildup (retention), breast swelling, and discomfort.  Keep all follow-up visits as told your health care provider. This is important. Contact a  health care provider if:  You feel, or think you feel, a lump in your breast.  You notice that both breasts look or feel different than usual.  Your breast is still causing pain after your menstrual period is over.  You find new lumps or bumps that were not there before.  You feel lumps in your armpit (axilla). Get help right away if:  You have severe pain, tenderness, redness, or warmth in your breast.  You have fluid or blood leaking from your nipple.  Your breast lump becomes hard and painful.  You notice dimpling or wrinkling of the breast or nipple. This information is not intended to replace advice given to you by your health care provider. Make sure you discuss any questions you have with your health care provider. Document Released: 01/27/2005 Document Revised: 10/19/2015 Document Reviewed: 10/19/2015 Elsevier Interactive Patient Education  2017 Elsevier Inc.  

## 2017-03-30 NOTE — Progress Notes (Signed)
Pt noticed lump on right breast a few months ago. Occasionally causes pain

## 2017-03-30 NOTE — Progress Notes (Signed)
Subjective:     Madison Guzman is a 22 y.o. femaleLazarus Salines G1P1001 here for a problem gyn visit with a breast lump/pain. She reports she noticed a tender lump in her right breast around Christmas but her new baby had recently come home from the NICU so she did not address her own health.  She continues to feel the lump and it is intermittently painful, a soreness pain.  She is breastfeeding, mostly pumping, and at first thought it was a plugged duct but it has not changed. There is no redness. She denies any fever/chills or n/v.  She is doing well overall but wants to make sure the breast lump is not something to worry about.  .   Gynecologic History No LMP recorded. Contraception: none Last Pap: n/a related to age  Obstetric History OB History  Gravida Para Term Preterm AB Living  1 1 1  0 0 1  SAB TAB Ectopic Multiple Live Births  0 0 0 0 1    # Outcome Date GA Lbr Len/2nd Weight Sex Delivery Anes PTL Lv  1 Term 11/05/16 [id number] 14:00 / 01:18 7 lb 7.9 oz (3.4 kg) F Vag-Spont EPI  LIV       The following portions of the patient's history were reviewed and updated as appropriate: allergies, current medications, past family history, past medical history, past social history, past surgical history and problem list.  Review of Systems Pertinent items noted in HPI and remainder of comprehensive ROS otherwise negative.    Objective:  BP 133/82   Pulse 93   Ht 5\' 4"  (1.626 m)   Wt 234 lb (106.1 kg)   Breastfeeding? Yes   BMI 40.17 kg/m  VS reviewed, nursing note reviewed,  Constitutional: well developed, well nourished, no distress HEENT: normocephalic CV: normal rate Pulm/chest wall: normal effort Breast Exam:  right breast with one smooth, round, mobile mass ~ 1 cm in diameter, in right upper outer quadrant, left breast normal without mass, skin or nipple changes or axillary nodes Abdomen: soft Neuro: alert and oriented x 3 Skin: warm, dry Psych: affect normal Pelvic exam:  Deferred  Assessment/Plan:   1. Mass of upper outer quadrant of right breast --Exam c/w cyst of right breast, likely benign. Reassurance provided to pt.  Outpatient US for follow up is ordered. --Heat/Tylenol for pain. Continue to pump/breastfeed. - US BREAST LTD UNI RIGHT INC AXILLA; Future

## 2017-04-03 ENCOUNTER — Ambulatory Visit
Admission: RE | Admit: 2017-04-03 | Discharge: 2017-04-03 | Disposition: A | Discharge status: Home / Self Care | Payer: Medicaid Other | Source: Ambulatory Visit | Attending: Advanced Practice Midwife | Admitting: Advanced Practice Midwife

## 2017-04-03 ENCOUNTER — Other Ambulatory Visit: Payer: Self-pay | Admitting: Advanced Practice Midwife

## 2017-04-03 DIAGNOSIS — N6311 Unspecified lump in the right breast, upper outer quadrant: Secondary | ICD-10-CM

## 2017-04-07 ENCOUNTER — Ambulatory Visit
Admission: RE | Admit: 2017-04-07 | Discharge: 2017-04-07 | Disposition: A | Discharge status: Home / Self Care | Payer: Medicaid Other | Source: Ambulatory Visit | Attending: Advanced Practice Midwife | Admitting: Advanced Practice Midwife

## 2017-05-05 ENCOUNTER — Other Ambulatory Visit: Payer: Self-pay

## 2017-05-05 ENCOUNTER — Emergency Department
Admission: EM | Admit: 2017-05-05 | Discharge: 2017-05-05 | Disposition: A | Discharge status: Home / Self Care | Payer: Medicaid Other | Source: Home / Self Care

## 2017-05-05 DIAGNOSIS — J069 Acute upper respiratory infection, unspecified: Secondary | ICD-10-CM

## 2017-05-05 DIAGNOSIS — J45909 Unspecified asthma, uncomplicated: Secondary | ICD-10-CM

## 2017-05-05 MED ORDER — ALBUTEROL SULFATE HFA 108 (90 BASE) MCG/ACT IN AERS: 1.0000 | INHALATION_SPRAY | Freq: Four times a day (QID) | RESPIRATORY_TRACT | 0 refills | Status: DC | PRN

## 2017-05-05 MED ORDER — IPRATROPIUM-ALBUTEROL 0.5-2.5 (3) MG/3ML IN SOLN
3.0000 mL | Freq: Four times a day (QID) | RESPIRATORY_TRACT | Status: DC
  Administered 2017-05-05: 3 mL via RESPIRATORY_TRACT

## 2017-05-05 NOTE — ED Provider Notes (Signed)
[facility]    CSN: [id number] Arrival date & time: 05/05/17  4098     History   Chief Complaint Chief Complaint  Patient presents with  . Cough  . Sore Throat    HPI Madison Guzman is a 22 y.o. female.   The history is provided by the patient. A language interpreter was used.  Cough  Cough characteristics:  Non-productive Sputum characteristics:  Nondescript Severity:  Mild Onset quality:  Gradual Duration:  1 day Timing:  Constant Progression:  Worsening Chronicity:  New Smoker: no   Relieved by:  Nothing Worsened by:  Nothing Associated symptoms: no fever   Sore Throat     Past Medical History:  Diagnosis Date  . Asthma   . Depression   . Migraine    Pt reports since age of 22  . Pharyngitis 01/08/2013   [facility] - strep neg    Patient Active Problem List   Diagnosis Date Noted  . Pseudotumor cerebri syndrome 09/22/2014  . Chronic daily headache 06/16/2013  . 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 Migraine without aura and without status migrainosus, not intractable 12/01/2012  . Tension headache 12/01/2012  . Anxiety state, unspecified 12/01/2012  . Insomnia 12/01/2012  . Depression 09/16/2012  . Asthma, moderate persistent, well-controlled 09/16/2012  . Allergic rhinitis 09/16/2012  . Vitamin D deficiency 04/15/2012    Past Surgical History:  Procedure Laterality Date  . TYMPANOSTOMY TUBE PLACEMENT Bilateral     OB History    Gravida  1   Para  1   Term  1   Preterm  0   AB  0   Living  1     SAB  0   TAB  0   Ectopic  0   Multiple  0   Live Births  1            Home Medications    Prior to Admission medications   Medication Sig Start Date End Date Taking? Authorizing Provider  acetaminophen (TYLENOL) 325 MG tablet Take 650 mg by mouth every 6 (six) hours as needed.    [provider]  albuterol (PROVENTIL HFA;VENTOLIN HFA) 108 (90 Base) MCG/ACT inhaler Inhale 1-2 puffs into the  lungs every 6 (six) hours as needed for wheezing or shortness of breath. 05/05/17   Elson Areas, PA-C  OVER THE COUNTER MEDICATION Take 2 tablets by mouth daily.    [provider]    Family History Family History  Problem Relation Age of Onset  . Asthma Mother   . Hypertension Mother   . Diabetes Maternal Grandmother   . Hyperlipidemia Maternal Grandmother   . Hypertension Maternal Grandmother   . Mental illness Maternal Grandmother   . Migraines Maternal Grandmother   . Seizures Father   . ADD / ADHD Brother        1 Younger brother has ADHD  . Mental illness Maternal Aunt        Bipolar  . ADD / ADHD Maternal Uncle     Social History Social History   Tobacco Use  . Smoking status: Passive Smoke Exposure - Never Smoker  . Smokeless tobacco: Never Used  Substance Use Topics  . Alcohol use: No    Alcohol/week: 0.0 oz  . Drug use: No     Allergies   Patient has no known allergies.   Review of Systems Review of Systems  Constitutional: Negative for fever.  Respiratory: Positive for cough.  All other systems reviewed and are negative.    Physical Exam Triage Vital Signs ED Triage Vitals  Enc Vitals Group     BP 05/05/17 0952 122/80     Pulse Rate 05/05/17 0952 (!) 106     Resp 05/05/17 0952 18     Temp 05/05/17 0952 98.8 F (37.1 C)     Temp Source 05/05/17 0952 Oral     SpO2 05/05/17 0952 98 %     Weight 05/05/17 0953 242 lb (109.8 kg)     Height --      Head Circumference --      Peak Flow --      Pain Score 05/05/17 0953 0     Pain Loc --      Pain Edu? --      Excl. in GC? --    No data found.  Updated Vital Signs BP 122/80 (BP Location: Right Arm)   Pulse (!) 106   Temp 98.8 F (37.1 C) (Oral)   Resp 18   Wt 242 lb (109.8 kg)   SpO2 98%   BMI 41.54 kg/m   Visual Acuity Right Eye Distance:   Left Eye Distance:   Bilateral Distance:    Right Eye Near:   Left Eye Near:    Bilateral Near:     Physical Exam    Constitutional: She appears well-developed and well-nourished. No distress.  HENT:  Head: Normocephalic and atraumatic.  Mouth/Throat: Mucous membranes are normal.  Eyes: Conjunctivae are normal.  Neck: Normal range of motion. Neck supple.  Cardiovascular: Normal rate and regular rhythm.  No murmur heard. Pulmonary/Chest: No respiratory distress. She has wheezes.  Abdominal: Soft. There is no tenderness.  Musculoskeletal: She exhibits no edema.  Neurological: She is alert.  Skin: Skin is warm and dry.  Psychiatric: She has a normal mood and affect.  Nursing note and vitals reviewed.    UC Treatments / Results  Labs (all labs ordered are listed, but only abnormal results are displayed) Labs Reviewed - No data to display  EKG None Radiology No results found.  Procedures Procedures (including critical care time)  Medications Ordered in UC Medications  ipratropium-albuterol (DUONEB) 0.5-2.5 (3) MG/3ML nebulizer solution 3 mL (3 mLs Nebulization Given 05/05/17 1029)   Pt feels better after duoneb.  I suspect viral illness or allergies.  Pt given rx for inhaler.   Initial Impression / Assessment and Plan / UC Course  I have reviewed the triage vital signs and the nursing notes.  Pertinent labs & imaging results that were available during my care of the patient were reviewed by me and considered in my medical decision making (see chart for details).     An After Visit Summary was printed and given to the patient.   Final Clinical Impressions(s) / UC Diagnoses   Final diagnoses:  Viral upper respiratory tract infection  Moderate asthma, unspecified whether complicated, unspecified whether persistent    ED Discharge Orders        Ordered    albuterol (PROVENTIL HFA;VENTOLIN HFA) 108 (90 Base) MCG/ACT inhaler  Every 6 hours PRN     05/05/17 1041       Controlled Substance Prescriptions [place] Controlled Substance Registry consulted? Not Applicable   Elson AreasSofia, Yamna Mackel  K, [place]PA-C 05/05/17 1043

## 2017-05-05 NOTE — ED Triage Notes (Signed)
Pt c/o cold like symptoms since last night. Dry cough, Migraine, Fever (100.8 last night). Has been taking tylenol as needed. Feels pain in chest, thinks its related to her asthma.

## 2018-02-03 DIAGNOSIS — Z3A16 16 weeks gestation of pregnancy: Secondary | ICD-10-CM | POA: Diagnosis not present

## 2018-02-03 DIAGNOSIS — E860 Dehydration: Secondary | ICD-10-CM | POA: Diagnosis not present

## 2018-02-03 DIAGNOSIS — O26892 Other specified pregnancy related conditions, second trimester: Secondary | ICD-10-CM | POA: Diagnosis not present

## 2018-02-03 DIAGNOSIS — R42 Dizziness and giddiness: Secondary | ICD-10-CM | POA: Diagnosis not present

## 2018-02-10 NOTE — L&D Delivery Note (Signed)
Delivery Note At 5:51 AM a viable female was delivered via Vaginal, Spontaneous (Presentation: LOA;  ).  APGAR: &weight not available at the time of this note .   Placenta status: whole and intact with 3-vessel cord   Anesthesia:  local Episiotomy: None Lacerations:  Second degree Suture Repair: 3.0 vicryl rapide Est. Blood Loss (mL):  100  Mom to postpartum.  Baby to Couplet care / Skin to Skin.  Madison Guzman 07/25/2018, 6:15 AM

## 2018-02-23 ENCOUNTER — Ambulatory Visit (INDEPENDENT_AMBULATORY_CARE_PROVIDER_SITE_OTHER): Payer: Medicaid Other | Admitting: Advanced Practice Midwife

## 2018-02-23 ENCOUNTER — Encounter: Payer: Self-pay | Admitting: Advanced Practice Midwife

## 2018-02-23 VITALS — BP 115/80 | HR 86 | Wt 233.0 lb

## 2018-02-23 DIAGNOSIS — Z3A19 19 weeks gestation of pregnancy: Secondary | ICD-10-CM

## 2018-02-23 DIAGNOSIS — Z3480 Encounter for supervision of other normal pregnancy, unspecified trimester: Secondary | ICD-10-CM

## 2018-02-23 DIAGNOSIS — Z3482 Encounter for supervision of other normal pregnancy, second trimester: Secondary | ICD-10-CM

## 2018-02-23 DIAGNOSIS — O219 Vomiting of pregnancy, unspecified: Secondary | ICD-10-CM

## 2018-02-23 NOTE — Progress Notes (Signed)
Pt is getting records sent from previous OB office/ PT states blood work and pap smear have been done

## 2018-02-23 NOTE — Patient Instructions (Signed)

## 2018-02-23 NOTE — Progress Notes (Signed)
Subjective:   Madison Guzman is a 23 y.o. G2P1001 at 2855w4d by early ultrasound being seen today for her first obstetrical visit.  Her obstetrical history is significant for vaginal delivery x 1 and has Depression; Asthma, moderate persistent, well-controlled; Allergic rhinitis; Vitamin D deficiency; Migraine without aura and without status migrainosus, not intractable; Tension headache; Anxiety state, unspecified; Insomnia; Chronic daily headache; Tinnitus of left ear; and Pseudotumor cerebri syndrome on their problem list.. Patient does intend to breast feed. Pregnancy history fully reviewed.  Patient reports nausea.  HISTORY: OB History  Gravida Para Term Preterm AB Living  2 1 1  0 0 1  SAB TAB Ectopic Multiple Live Births  0 0 0 0 1    # Outcome Date GA Lbr Len/2nd Weight Sex Delivery Anes PTL Lv  2 Current           1 Term 11/05/16 8044w1d 14:00 / 01:18 3400 g F Vag-Spont EPI  LIV     Name: Mottola,GIRL Shaquitta     Apgar1: 0  Apgar5: 2   Past Medical History:  Diagnosis Date  . Asthma   . Depression   . Migraine    Pt reports since age of 89  . Pharyngitis 01/08/2013   [facility] - strep neg   Past Surgical History:  Procedure Laterality Date  . TYMPANOSTOMY TUBE PLACEMENT Bilateral    Family History  Problem Relation Age of Onset  . Asthma Mother   . Hypertension Mother   . Diabetes Maternal Grandmother   . Hyperlipidemia Maternal Grandmother   . Hypertension Maternal Grandmother   . Mental illness Maternal Grandmother   . Migraines Maternal Grandmother   . Seizures Father   . ADD / ADHD Brother        1 Younger brother has ADHD  . Mental illness Maternal Aunt        Bipolar  . ADD / ADHD Maternal Uncle    Social History   Tobacco Use  . Smoking status: Passive Smoke Exposure - Never Smoker  . Smokeless tobacco: Never Used  Substance Use Topics  . Alcohol use: No    Alcohol/week: 0.0 standard drinks  . Drug use: No   No Known Allergies No  current outpatient medications on file prior to visit.   No current facility-administered medications on file prior to visit.      Exam   Vitals:   02/23/18 0853  BP: 115/80  Pulse: 86  Weight: 105.7 kg   Fetal Heart Rate (bpm): 148  Uterus:     Pelvic Exam: Perineum: no hemorrhoids, normal perineum   Vulva: normal external genitalia, no lesions   Vagina:  normal mucosa, normal discharge   Cervix: no lesions and normal, pap smear done.    Adnexa: normal adnexa and no mass, fullness, tenderness   Bony Pelvis: average  System: General: well-developed, well-nourished female in no acute distress   Breast:  normal appearance, no masses or tenderness   Skin: normal coloration and turgor, no rashes   Neurologic: oriented, normal, negative, normal mood   Extremities: normal strength, tone, and muscle mass, ROM of all joints is normal   HEENT PERRLA, extraocular movement intact and sclera clear, anicteric   Mouth/Teeth mucous membranes moist, pharynx normal without lesions and dental hygiene good   Neck supple and no masses   Cardiovascular: regular rate and rhythm   Respiratory:  no respiratory distress, normal breath sounds   Abdomen: soft, non-tender; bowel sounds normal;  no masses,  no organomegaly     Assessment:   Pregnancy: G2P1001 Patient Active Problem List   Diagnosis Date Noted  . Pseudotumor cerebri syndrome 09/22/2014  . Chronic daily headache 06/16/2013  . 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 Migraine without aura and without status migrainosus, not intractable 12/01/2012  . Tension headache 12/01/2012  . Anxiety state, unspecified 12/01/2012  . Insomnia 12/01/2012  . Depression 09/16/2012  . Asthma, moderate persistent, well-controlled 09/16/2012  . Allergic rhinitis 09/16/2012  . Vitamin D deficiency 04/15/2012     Plan:  1. Supervision of other normal pregnancy, antepartum --Anticipatory guidance about next visits/weeks of pregnancy given.  2. [redacted] weeks  gestation of pregnancy - US MFM OB COMP + 14 WK; Future  3. Traumatic birth --Pt first baby was born at [facility] and after birth was resuscitated and went to NICU. Pt reports she does not want to deliver at [facility] this time. --Explained that our providers have privileges at [facility], and pt reported interest in switching practices to deliver somewhere else.  Explained [facility] February.  Pt will think about and consider staying to deliver at the new hospital.    4. Nausea and vomiting during pregnancy prior to [redacted] weeks gestation --Declines medication, recommend small frequent meals, bland foods, stay hydrated.   Continue prenatal vitamins. Genetic screening done at prior OB office, records requested Ultrasound discussed; fetal anatomic survey: ordered. Problem list reviewed and updated. The nature of [facility] with multiple MDs and other Advanced Practice Providers was explained to patient; also emphasized that residents, students are part of our team. Routine obstetric precautions reviewed. No follow-ups on file.   Sharen CounterLisa Leftwich-Kirby, CNM 02/23/18 9:24 AM

## 2018-03-01 ENCOUNTER — Ambulatory Visit (HOSPITAL_COMMUNITY)
Admission: RE | Admit: 2018-03-01 | Discharge: 2018-03-01 | Disposition: A | Discharge status: Home / Self Care | Payer: Medicaid - Out of State | Source: Ambulatory Visit | Attending: Advanced Practice Midwife | Admitting: Advanced Practice Midwife

## 2018-03-01 ENCOUNTER — Other Ambulatory Visit (HOSPITAL_COMMUNITY): Payer: Self-pay | Admitting: *Deleted

## 2018-03-01 ENCOUNTER — Other Ambulatory Visit: Payer: Self-pay | Admitting: Advanced Practice Midwife

## 2018-03-01 DIAGNOSIS — Z3A19 19 weeks gestation of pregnancy: Secondary | ICD-10-CM | POA: Diagnosis not present

## 2018-03-01 DIAGNOSIS — Z3A20 20 weeks gestation of pregnancy: Secondary | ICD-10-CM | POA: Diagnosis not present

## 2018-03-01 DIAGNOSIS — O99212 Obesity complicating pregnancy, second trimester: Secondary | ICD-10-CM | POA: Diagnosis not present

## 2018-03-01 DIAGNOSIS — Z363 Encounter for antenatal screening for malformations: Secondary | ICD-10-CM

## 2018-03-01 DIAGNOSIS — Z362 Encounter for other antenatal screening follow-up: Secondary | ICD-10-CM

## 2018-03-23 ENCOUNTER — Encounter: Payer: Self-pay | Admitting: Advanced Practice Midwife

## 2018-03-26 ENCOUNTER — Ambulatory Visit (INDEPENDENT_AMBULATORY_CARE_PROVIDER_SITE_OTHER): Payer: Self-pay

## 2018-03-26 VITALS — BP 124/74 | HR 93 | Wt 234.1 lb

## 2018-03-26 DIAGNOSIS — Z3482 Encounter for supervision of other normal pregnancy, second trimester: Secondary | ICD-10-CM

## 2018-03-26 DIAGNOSIS — Z3A24 24 weeks gestation of pregnancy: Secondary | ICD-10-CM

## 2018-03-26 DIAGNOSIS — Z3480 Encounter for supervision of other normal pregnancy, unspecified trimester: Secondary | ICD-10-CM

## 2018-03-26 NOTE — Progress Notes (Signed)
   PRENATAL VISIT NOTE  Subjective:  Madison Guzman is a 23 y.o. G2P1001 at [redacted]w[redacted]d being seen today for ongoing prenatal care.  She is currently monitored for the following issues for this low-risk pregnancy and has Depression; Asthma, moderate persistent, well-controlled; Migraine without aura and without status migrainosus, not intractable; Anxiety state, unspecified; Insomnia; Chronic daily headache; Pseudotumor cerebri syndrome; and Supervision of normal pregnancy on their problem list.  Patient reports no complaints.  Contractions: Not present. Vag. Bleeding: None.  Movement: Present. Denies leaking of fluid.   The following portions of the patient's history were reviewed and updated as appropriate: allergies, current medications, past family history, past medical history, past social history, past surgical history and problem list. Problem list updated.  Objective:   Vitals:   03/26/18 1017  BP: 124/74  Pulse: 93  Weight: 234 lb 1.3 oz (106.2 kg)    Fetal Status: Fetal Heart Rate (bpm): 146   Movement: Present     General:  Alert, oriented and cooperative. Patient is in no acute distress.  Skin: Skin is warm and dry. No rash noted.   Cardiovascular: Normal heart rate noted  Respiratory: Normal respiratory effort, no problems with respiration noted  Abdomen: Soft, gravid, appropriate for gestational age.  Pain/Pressure: Present     Pelvic: Cervical exam deferred        Extremities: Normal range of motion.  Edema: None  Mental Status: Normal mood and affect. Normal behavior. Normal judgment and thought content.   Assessment and Plan:  Pregnancy: G2P1001 at [redacted]w[redacted]d  1. Supervision of other normal pregnancy, antepartum - No complaints. Routine care - Anticipatory guidance of next visits reviewed with patient. GTT and labs next visit  2. Traumatic birth - Patient requesting elective primary c/s. She states she does not want to labor because of the trauma of her last delivery.  Lengthy discussion with patient of fears. Reviewed risks of c/s versus vaginal delivery including recovery time. Patient with limited social support at home and has 44 month old. Patient will be scheduled with MD next visit to discuss elective c/s.   Preterm labor symptoms and general obstetric precautions including but not limited to vaginal bleeding, contractions, leaking of fluid and fetal movement were reviewed in detail with the patient. Please refer to After Visit Summary for other counseling recommendations.  Return in about 4 weeks (around 04/23/2018) for Return OB visit, 2hr GTT and labs.  Future Appointments  Date Time Provider Department Center  03/30/2018  9:30 AM [facility] [place] 5 [facility]  04/26/2018  8:15 AM Allie Bossier, MD [facility]    Rolm Bookbinder, [place]  03/26/18 11:25 AM

## 2018-03-26 NOTE — Patient Instructions (Signed)

## 2018-03-30 ENCOUNTER — Ambulatory Visit (HOSPITAL_COMMUNITY): Payer: Medicaid Other | Attending: Advanced Practice Midwife

## 2018-03-30 ENCOUNTER — Encounter (HOSPITAL_COMMUNITY): Payer: Self-pay

## 2018-04-20 ENCOUNTER — Ambulatory Visit (HOSPITAL_COMMUNITY): Payer: Self-pay

## 2018-04-26 ENCOUNTER — Ambulatory Visit (INDEPENDENT_AMBULATORY_CARE_PROVIDER_SITE_OTHER): Payer: Medicaid Other | Admitting: Obstetrics & Gynecology

## 2018-04-26 ENCOUNTER — Other Ambulatory Visit: Payer: Self-pay

## 2018-04-26 ENCOUNTER — Other Ambulatory Visit (HOSPITAL_COMMUNITY)
Admission: RE | Admit: 2018-04-26 | Discharge: 2018-04-26 | Disposition: A | Discharge status: Home / Self Care | Payer: Medicaid Other | Source: Ambulatory Visit | Attending: Obstetrics & Gynecology | Admitting: Obstetrics & Gynecology

## 2018-04-26 VITALS — BP 127/80 | HR 94 | Wt 238.0 lb

## 2018-04-26 DIAGNOSIS — Z23 Encounter for immunization: Secondary | ICD-10-CM | POA: Diagnosis not present

## 2018-04-26 DIAGNOSIS — Z3480 Encounter for supervision of other normal pregnancy, unspecified trimester: Secondary | ICD-10-CM | POA: Diagnosis not present

## 2018-04-26 DIAGNOSIS — F439 Reaction to severe stress, unspecified: Secondary | ICD-10-CM

## 2018-04-26 DIAGNOSIS — Z3483 Encounter for supervision of other normal pregnancy, third trimester: Secondary | ICD-10-CM

## 2018-04-26 DIAGNOSIS — Z3A28 28 weeks gestation of pregnancy: Secondary | ICD-10-CM

## 2018-04-26 NOTE — Addendum Note (Signed)
Addended by: Kathie Dike on: 04/26/2018 09:02 AM   Modules accepted: Orders

## 2018-04-26 NOTE — Addendum Note (Signed)
Addended by: Allie Bossier on: 04/26/2018 01:01 PM   Modules accepted: Orders

## 2018-04-26 NOTE — Progress Notes (Signed)
   PRENATAL VISIT NOTE  Subjective:  Madison Guzman is a 23 y.o. G2P1001 at [redacted]w[redacted]d being seen today for ongoing prenatal care.  She is currently monitored for the following issues for this low-risk pregnancy and has Depression; Asthma, moderate persistent, well-controlled; Migraine without aura and without status migrainosus, not intractable; Anxiety state, unspecified; Insomnia; Chronic daily headache; Pseudotumor cerebri syndrome; Supervision of normal pregnancy; and Obesity in pregnancy on their problem list.  Patient reports no complaints. She is worried/stressed about being alone at the time of delivery.  Contractions: Not present. Vag. Bleeding: None.  Movement: Present. Denies leaking of fluid.   The following portions of the patient's history were reviewed and updated as appropriate: allergies, current medications, past family history, past medical history, past social history, past surgical history and problem list.   Objective:   Vitals:   04/26/18 0833  BP: 127/80  Pulse: 94  Weight: 238 lb (108 kg)    Fetal Status: Fetal Heart Rate (bpm): 137   Movement: Present     General:  Alert, oriented and cooperative. Patient is in no acute distress.  Skin: Skin is warm and dry. No rash noted.   Cardiovascular: Normal heart rate noted  Respiratory: Normal respiratory effort, no problems with respiration noted  Abdomen: Soft, gravid, appropriate for gestational age.  Pain/Pressure: Absent     Pelvic: Cervical exam deferred        Extremities: Normal range of motion.  Edema: None  Mental Status: Normal mood and affect. Normal behavior. Normal judgment and thought content.   Assessment and Plan:  Pregnancy: G2P1001 at [redacted]w[redacted]d 1. Supervision of other normal pregnancy, antepartum  - Tdap vaccine greater than or equal to 7yo IM - HIV antibody (with reflex) - CBC - RPR - 2Hr GTT w/ 1 Hr Carpenter 75 g  2. Stress- refer to integrated b med  Preterm labor symptoms and general  obstetric precautions including but not limited to vaginal bleeding, contractions, leaking of fluid and fetal movement were reviewed in detail with the patient. Please refer to After Visit Summary for other counseling recommendations.   Return in about 3 weeks (around 05/17/2018).  Future Appointments  Date Time Provider Department Center  05/03/2018 10:45 AM [facility] [place] 5 [facility]-US    Allie Bossier, MD '

## 2018-04-27 LAB — HIV ANTIBODY (ROUTINE TESTING W REFLEX): HIV 1&2 Ab, 4th Generation: NONREACTIVE

## 2018-04-27 LAB — GC/CHLAMYDIA PROBE AMP (~~LOC~~) NOT AT ARMC
Chlamydia: NEGATIVE
Neisseria Gonorrhea: NEGATIVE

## 2018-04-27 LAB — OBSTETRIC PANEL
Absolute Monocytes: 538 cells/uL (ref 200–950)
Antibody Screen: NOT DETECTED
Basophils Absolute: 45 cells/uL (ref 0–200)
Basophils Relative: 0.4 %
Eosinophils Absolute: 157 cells/uL (ref 15–500)
Eosinophils Relative: 1.4 %
HCT: 36.7 % (ref 35.0–45.0)
Hemoglobin: 12.5 g/dL (ref 11.7–15.5)
Hepatitis B Surface Ag: NONREACTIVE
Lymphs Abs: 2408 cells/uL (ref 850–3900)
MCH: 28.9 pg (ref 27.0–33.0)
MCHC: 34.1 g/dL (ref 32.0–36.0)
MCV: 85 fL (ref 80.0–100.0)
MPV: 10.2 fL (ref 7.5–12.5)
Monocytes Relative: 4.8 %
Neutro Abs: 8053 cells/uL — ABNORMAL HIGH (ref 1500–7800)
Neutrophils Relative %: 71.9 %
Platelets: 304 10*3/uL (ref 140–400)
RBC: 4.32 10*6/uL (ref 3.80–5.10)
RDW: 13.5 % (ref 11.0–15.0)
RPR Ser Ql: NONREACTIVE
Rubella: 1.08 index
Total Lymphocyte: 21.5 %
WBC: 11.2 10*3/uL — ABNORMAL HIGH (ref 3.8–10.8)

## 2018-04-28 LAB — URINE CULTURE, OB REFLEX

## 2018-04-28 LAB — CULTURE, OB URINE

## 2018-05-03 ENCOUNTER — Telehealth: Payer: Self-pay | Admitting: Family Medicine

## 2018-05-03 ENCOUNTER — Encounter (HOSPITAL_COMMUNITY): Payer: Self-pay

## 2018-05-03 ENCOUNTER — Ambulatory Visit (HOSPITAL_COMMUNITY): Payer: Self-pay

## 2018-05-03 NOTE — Telephone Encounter (Signed)
Called patient to inform her of a virtual visit she will be getting tomorrow. Was not able to leave a VM at this time.

## 2018-05-04 ENCOUNTER — Telehealth: Payer: Self-pay | Admitting: Clinical

## 2018-05-04 ENCOUNTER — Institutional Professional Consult (permissible substitution): Payer: Self-pay

## 2018-05-04 ENCOUNTER — Other Ambulatory Visit: Payer: Medicaid Other

## 2018-05-04 NOTE — BH Specialist Note (Deleted)
error 

## 2018-05-04 NOTE — Telephone Encounter (Signed)
Called at 1:09pm, "unable to complete your call at this time", so unable to leave a message or complete visit.   Integrated Behavioral Health Visit via Telemedicine Attempt

## 2018-05-05 ENCOUNTER — Institutional Professional Consult (permissible substitution): Payer: Self-pay

## 2018-05-17 ENCOUNTER — Encounter: Payer: Medicaid Other | Admitting: Obstetrics & Gynecology

## 2018-05-25 ENCOUNTER — Other Ambulatory Visit (HOSPITAL_COMMUNITY)
Admission: RE | Admit: 2018-05-25 | Discharge: 2018-05-25 | Disposition: A | Discharge status: Home / Self Care | Payer: Medicaid Other | Source: Ambulatory Visit | Attending: Certified Nurse Midwife | Admitting: Certified Nurse Midwife

## 2018-05-25 ENCOUNTER — Other Ambulatory Visit: Payer: Self-pay

## 2018-05-25 ENCOUNTER — Ambulatory Visit (INDEPENDENT_AMBULATORY_CARE_PROVIDER_SITE_OTHER): Payer: Medicaid Other | Admitting: Certified Nurse Midwife

## 2018-05-25 VITALS — BP 124/82 | HR 97 | Wt 238.0 lb

## 2018-05-25 DIAGNOSIS — N898 Other specified noninflammatory disorders of vagina: Secondary | ICD-10-CM | POA: Diagnosis not present

## 2018-05-25 DIAGNOSIS — Z3480 Encounter for supervision of other normal pregnancy, unspecified trimester: Secondary | ICD-10-CM

## 2018-05-25 DIAGNOSIS — O26899 Other specified pregnancy related conditions, unspecified trimester: Secondary | ICD-10-CM

## 2018-05-25 DIAGNOSIS — O26893 Other specified pregnancy related conditions, third trimester: Secondary | ICD-10-CM

## 2018-05-25 DIAGNOSIS — Z3A32 32 weeks gestation of pregnancy: Secondary | ICD-10-CM

## 2018-05-25 NOTE — Progress Notes (Signed)
Pt states she may be leaking fluid

## 2018-05-25 NOTE — Patient Instructions (Signed)

## 2018-05-26 ENCOUNTER — Other Ambulatory Visit (INDEPENDENT_AMBULATORY_CARE_PROVIDER_SITE_OTHER): Payer: Medicaid Other

## 2018-05-26 DIAGNOSIS — Z3480 Encounter for supervision of other normal pregnancy, unspecified trimester: Secondary | ICD-10-CM

## 2018-05-26 LAB — CERVICOVAGINAL ANCILLARY ONLY
Bacterial vaginitis: POSITIVE — AB
Candida vaginitis: NEGATIVE
Chlamydia: NEGATIVE
Neisseria Gonorrhea: NEGATIVE
Trichomonas: NEGATIVE

## 2018-05-26 MED ORDER — METRONIDAZOLE 0.75 % VA GEL: 1.0000 | Freq: Every day | VAGINAL | 1 refills | Status: DC

## 2018-05-26 NOTE — Progress Notes (Signed)
PT came to do jellybean 2 hour GTT. I went out to give pt lab paperwork and tell her instructions. I told pt she had ten minutes to eat 27 jellybeans. Pt became upset and crying saying she cannot eat 27 jellybeans. I told pt the alternative was to drink the glucose drink and she declined. I told pt the importance of this test and knowing if she has gestational diabetes for the health of her and her baby. Pt declined test and went home.

## 2018-05-26 NOTE — Progress Notes (Signed)
   PRENATAL VISIT NOTE  Subjective:  Madison Guzman is a 23 y.o. G2P1001 at [redacted]w[redacted]d being seen today for ongoing prenatal care.  She is currently monitored for the following issues for this low-risk pregnancy and has Depression; Asthma, moderate persistent, well-controlled; Migraine without aura and without status migrainosus, not intractable; Anxiety state, unspecified; Insomnia; Chronic daily headache; Pseudotumor cerebri syndrome; Supervision of normal pregnancy; and Obesity in pregnancy on their problem list.  Patient reports vaginal discharge .  Contractions: Irritability. Vag. Bleeding: None.  Movement: Present. Denies leaking of fluid.   The following portions of the patient's history were reviewed and updated as appropriate: allergies, current medications, past family history, past medical history, past social history, past surgical history and problem list.   Objective:   Vitals:   05/25/18 1402  BP: 124/82  Pulse: 97  Weight: 238 lb (108 kg)    Fetal Status: Fetal Heart Rate (bpm): 159 Fundal Height: 36 cm Movement: Present     General:  Alert, oriented and cooperative. Patient is in no acute distress.  Skin: Skin is warm and dry. No rash noted.   Cardiovascular: Normal heart rate noted  Respiratory: Normal respiratory effort, no problems with respiration noted  Abdomen: Soft, gravid, appropriate for gestational age.  Pain/Pressure: Present     Pelvic: Cervical exam performed        Extremities: Normal range of motion.  Edema: Trace  Mental Status: Normal mood and affect. Normal behavior. Normal judgment and thought content.   Assessment and Plan:  Pregnancy: G2P1001 at [redacted]w[redacted]d 1. Supervision of other normal pregnancy, antepartum - Patient doing well - Anticipatory guidance on upcoming appointments including telehealth appointments  - COVID 19 precautions  - BP cuff given to patient in office today  - Babyscripts Schedule Optimization  2. Vaginal discharge during  pregnancy, antepartum - Patient reports thin watery vaginal discharge for the past 2 day, unsure if she is ruptured or not. Denies vaginal itching or irritation  - Sterile speculum examination performed, notes white thin discharge with mild odor, negative pooling, negative fern- most likely BV  - Patient reports hx of BV, unable to take oral medication and request vaginal medication if treatment is needed  - Cervix closed/thick  - Cervicovaginal ancillary only( [place]) - metroNIDAZOLE (METROGEL) 0.75 % vaginal gel; Place 1 Applicatorful vaginally at bedtime. Apply one applicatorful to vagina at bedtime for 5 days  Dispense: 70 g; Refill: 1  Preterm labor symptoms and general obstetric precautions including but not limited to vaginal bleeding, contractions, leaking of fluid and fetal movement were reviewed in detail with the patient. Please refer to After Visit Summary for other counseling recommendations.   Return in about 2 weeks (around 06/08/2018) for TELEHEALTH ROB.  Future Appointments  Date Time Provider Department Center  06/07/2018  2:15 PM Allie Bossier, MD [facility]  06/09/2018  3:15 PM [facility] [place] 4 [facility]    Sharyon Cable, CNM

## 2018-06-07 ENCOUNTER — Ambulatory Visit (INDEPENDENT_AMBULATORY_CARE_PROVIDER_SITE_OTHER): Payer: Medicaid Other | Admitting: Obstetrics & Gynecology

## 2018-06-07 ENCOUNTER — Other Ambulatory Visit: Payer: Self-pay

## 2018-06-07 VITALS — BP 119/75 | Wt 239.0 lb

## 2018-06-07 DIAGNOSIS — Z3A34 34 weeks gestation of pregnancy: Secondary | ICD-10-CM

## 2018-06-07 DIAGNOSIS — Z3483 Encounter for supervision of other normal pregnancy, third trimester: Secondary | ICD-10-CM

## 2018-06-07 DIAGNOSIS — Z3480 Encounter for supervision of other normal pregnancy, unspecified trimester: Secondary | ICD-10-CM

## 2018-06-07 NOTE — Progress Notes (Signed)
    PRENATAL VISIT NOTE TELEHEALTH VIRTUAL OBSTETRICS VISIT ENCOUNTER NOTE  I connected with@ on 06/07/18 at  2:15 PM EDT by Webex at home and verified that I am speaking with the correct person using two identifiers.   I discussed the limitations, risks, security and privacy concerns of performing an evaluation and management service by telephone and the availability of in person appointments. I also discussed with the patient that there may be a patient responsible charge related to this service. The patient expressed understanding and agreed to proceed. Subjective:  Madison Guzman is a 23 y.o. G2P1001 (Madison Guzman, 19 months)  at [redacted]w[redacted]d being seen today for ongoing prenatal care.  She is currently monitored for the following issues for this low-risk pregnancy and has Depression; Asthma, moderate persistent, well-controlled; Migraine without aura and without status migrainosus, not intractable; Anxiety state, unspecified; Insomnia; Chronic daily headache; Pseudotumor cerebri syndrome; Supervision of normal pregnancy; and Obesity in pregnancy on their problem list.  Patient reports no complaints.  Reports fetal movement. Contractions: Irritability. Vag. Bleeding: None.  Movement: Present. Denies any contractions, bleeding or leaking of fluid.   The following portions of the patient's history were reviewed and updated as appropriate: allergies, current medications, past family history, past medical history, past social history, past surgical history and problem list.   Objective:   Vitals:   06/07/18 1420  BP: 119/75    Fetal Status:     Movement: Present     General:  Alert, oriented and cooperative. Patient is in no acute distress.  Respiratory: Normal respiratory effort, no problems with respiration noted  Mental Status: Normal mood and affect. Normal behavior. Normal judgment and thought content.  Rest of physical exam deferred due to type of encounter  Assessment and Plan:  Pregnancy:  G2P1001 at [redacted]w[redacted]d 1. Supervision of other normal pregnancy, antepartum  - Babyscripts Schedule Optimization  Preterm labor symptoms and general obstetric precautions including but not limited to vaginal bleeding, contractions, leaking of fluid and fetal movement were reviewed in detail with the patient. I discussed the assessment and treatment plan with the patient. The patient was provided an opportunity to ask questions and all were answered. The patient agreed with the plan and demonstrated an understanding of the instructions. The patient was advised to call back or seek an in-person office evaluation/go to [facility] at [facility] for any urgent or concerning symptoms. Please refer to After Visit Summary for other counseling recommendations.  I provided 10 minutes of non-face-to-face time during this encounter. No follow-ups on file.  Future Appointments  Date Time Provider Department Center  06/09/2018  3:15 PM [facility] [place] 4 [facility]-US    Allie Bossier, MD Center for [facility], [facility]

## 2018-06-07 NOTE — Progress Notes (Signed)
Pt c/o increased pain and pressure BP today: 119/75

## 2018-06-08 ENCOUNTER — Encounter: Payer: Self-pay | Admitting: Certified Nurse Midwife

## 2018-06-08 ENCOUNTER — Other Ambulatory Visit: Payer: Self-pay

## 2018-06-08 NOTE — Progress Notes (Signed)
error 

## 2018-06-09 ENCOUNTER — Other Ambulatory Visit: Payer: Self-pay

## 2018-06-09 ENCOUNTER — Ambulatory Visit (HOSPITAL_COMMUNITY)
Admission: RE | Admit: 2018-06-09 | Discharge: 2018-06-09 | Disposition: A | Discharge status: Home / Self Care | Payer: Medicaid Other | Source: Ambulatory Visit | Attending: Maternal & Fetal Medicine | Admitting: Maternal & Fetal Medicine

## 2018-06-09 DIAGNOSIS — Z362 Encounter for other antenatal screening follow-up: Secondary | ICD-10-CM | POA: Insufficient documentation

## 2018-06-21 ENCOUNTER — Telehealth: Payer: Self-pay | Admitting: *Deleted

## 2018-06-21 NOTE — Telephone Encounter (Signed)
Could not leave patient a message to answer screening questions and inform about masking/no visitors policy.

## 2018-06-23 ENCOUNTER — Other Ambulatory Visit (HOSPITAL_COMMUNITY)
Admission: RE | Admit: 2018-06-23 | Discharge: 2018-06-23 | Disposition: A | Discharge status: Home / Self Care | Payer: Medicaid Other | Source: Ambulatory Visit | Attending: Obstetrics and Gynecology | Admitting: Obstetrics and Gynecology

## 2018-06-23 ENCOUNTER — Ambulatory Visit (INDEPENDENT_AMBULATORY_CARE_PROVIDER_SITE_OTHER): Payer: Medicaid Other | Admitting: Obstetrics and Gynecology

## 2018-06-23 ENCOUNTER — Other Ambulatory Visit: Payer: Self-pay

## 2018-06-23 VITALS — BP 129/76 | HR 93 | Wt 239.0 lb

## 2018-06-23 DIAGNOSIS — Z3480 Encounter for supervision of other normal pregnancy, unspecified trimester: Secondary | ICD-10-CM | POA: Insufficient documentation

## 2018-06-23 DIAGNOSIS — Z3483 Encounter for supervision of other normal pregnancy, third trimester: Secondary | ICD-10-CM

## 2018-06-23 DIAGNOSIS — Z3A36 36 weeks gestation of pregnancy: Secondary | ICD-10-CM

## 2018-06-23 MED ORDER — GLUCOSE BLOOD VI STRP: ORAL_STRIP | 12 refills | Status: DC

## 2018-06-23 MED ORDER — ACCU-CHEK FASTCLIX LANCETS MISC: 1.0000 | Freq: Four times a day (QID) | 12 refills | Status: DC

## 2018-06-23 MED ORDER — ACCU-CHEK GUIDE W/DEVICE KIT: 1.0000 | PACK | Freq: Once | 0 refills | Status: AC

## 2018-06-23 NOTE — Patient Instructions (Signed)
Blood Glucose Monitoring, Adult Monitoring your blood sugar (glucose) is an important part of managing your diabetes (diabetes mellitus). Blood glucose monitoring involves checking your blood glucose as often as directed and keeping a record (log) of your results over time. Checking your blood glucose regularly and keeping a blood glucose log can:  Help you and your health care provider adjust your diabetes management plan as needed, including your medicines or insulin.  Help you understand how food, exercise, illnesses, and medicines affect your blood glucose.  Let you know what your blood glucose is at any time. You can quickly find out if you have low blood glucose (hypoglycemia) or high blood glucose (hyperglycemia). Your health care provider will set individualized treatment goals for you. Your goals will be based on your age, other medical conditions you have, and how you respond to diabetes treatment. Generally, the goal of treatment is to maintain the following blood glucose levels:  Before meals (preprandial): 80-130 mg/dL (4.4-7.2 mmol/L).  After meals (postprandial): below 180 mg/dL (10 mmol/L).  A1c level: less than 7%. Supplies needed:  Blood glucose meter.  Test strips for your meter. Each meter has its own strips. You must use the strips that came with your meter.  A needle to prick your finger (lancet). Do not use a lancet more than one time.  A device that holds the lancet (lancing device).  A journal or log book to write down your results. How to check your blood glucose  1. Wash your hands with soap and water. 2. Prick the side of your finger (not the tip) with the lancet. Use a different finger each time. 3. Gently rub the finger until a small drop of blood appears. 4. Follow instructions that come with your meter for inserting the test strip, applying blood to the strip, and using your blood glucose meter. 5. Write down your result and any notes. Some meters  allow you to use areas of your body other than your finger (alternative sites) to test your blood. The most common alternative sites are:  Forearm.  Thigh.  Palm of the hand. If you think you may have hypoglycemia, or if you have a history of not knowing when your blood glucose is getting low (hypoglycemia unawareness), do not use alternative sites. Use your finger instead. Alternative sites may not be as accurate as the fingers, because blood flow is slower in these areas. This means that the result you get may be delayed, and it may be different from the result that you would get from your finger. Follow these instructions at home: Blood glucose log   Every time you check your blood glucose, write down your result. Also write down any notes about things that may be affecting your blood glucose, such as your diet and exercise for the day. This information can help you and your health care provider: ? Look for patterns in your blood glucose over time. ? Adjust your diabetes management plan as needed.  Check if your meter allows you to download your records to a computer. Most glucose meters store a record of glucose readings in the meter. If you have type 1 diabetes:  Check your blood glucose 2 or more times a day.  Also check your blood glucose: ? Before every insulin injection. ? Before and after exercise. ? Before meals. ? 2 hours after a meal. ? Occasionally between 2:00 a.m. and 3:00 a.m., as directed. ? Before potentially dangerous tasks, like driving or using heavy machinery. ?   At bedtime.  You may need to check your blood glucose more often, up to 6-10 times a day, if you: ? Use an insulin pump. ? Need multiple daily injections (MDI). ? Have diabetes that is not well-controlled. ? Are ill. ? Have a history of severe hypoglycemia. ? Have hypoglycemia unawareness. If you have type 2 diabetes:  If you take insulin or other diabetes medicines, check your blood glucose 2 or  more times a day.  If you are on intensive insulin therapy, check your blood glucose 4 or more times a day. Occasionally, you may also need to check between 2:00 a.m. and 3:00 a.m., as directed.  Also check your blood glucose: ? Before and after exercise. ? Before potentially dangerous tasks, like driving or using heavy machinery.  You may need to check your blood glucose more often if: ? Your medicine is being adjusted. ? Your diabetes is not well-controlled. ? You are ill. General tips  Always keep your supplies with you.  If you have questions or need help, all blood glucose meters have a 24-hour "hotline" phone number that you can call. You may also contact your health care provider.  After you use a few boxes of test strips, adjust (calibrate) your blood glucose meter by following instructions that came with your meter. Contact a health care provider if:  Your blood glucose is at or above 240 mg/dL (13.3 mmol/L) for 2 days in a row.  You have been sick or have had a fever for 2 days or longer, and you are not getting better.  You have any of the following problems for more than 6 hours: ? You cannot eat or drink. ? You have nausea or vomiting. ? You have diarrhea. Get help right away if:  Your blood glucose is lower than 54 mg/dL (3 mmol/L).  You become confused or you have trouble thinking clearly.  You have difficulty breathing.  You have moderate or large ketone levels in your urine. Summary  Monitoring your blood sugar (glucose) is an important part of managing your diabetes (diabetes mellitus).  Blood glucose monitoring involves checking your blood glucose as often as directed and keeping a record (log) of your results over time.  Your health care provider will set individualized treatment goals for you. Your goals will be based on your age, other medical conditions you have, and how you respond to diabetes treatment.  Every time you check your blood glucose,  write down your result. Also write down any notes about things that may be affecting your blood glucose, such as your diet and exercise for the day. This information is not intended to replace advice given to you by your health care provider. Make sure you discuss any questions you have with your health care provider. Document Released: 01/30/2003 Document Revised: 12/08/2016 Document Reviewed: 07/09/2015 Elsevier Interactive Patient Education  2019 Elsevier Inc.  

## 2018-06-23 NOTE — Progress Notes (Signed)
   PRENATAL VISIT NOTE  Subjective:  Madison Guzman is a 23 y.o. G2P1001 at [redacted]w[redacted]d being seen today for ongoing prenatal care.  She is currently monitored for the following issues for this low-risk pregnancy and has Depression; Asthma, moderate persistent, well-controlled; Migraine without aura and without status migrainosus, not intractable; Anxiety state, unspecified; Insomnia; Chronic daily headache; Pseudotumor cerebri syndrome; Supervision of normal pregnancy; and Obesity in pregnancy on their problem list.  Patient reports no complaints.  Contractions: Irritability. Vag. Bleeding: None.  Movement: Present. Denies leaking of fluid.   The following portions of the patient's history were reviewed and updated as appropriate: allergies, current medications, past family history, past medical history, past social history, past surgical history and problem list.   Objective:   Vitals:   06/23/18 1016  BP: 129/76  Pulse: 93  Weight: 239 lb (108.4 kg)    Fetal Status: Fetal Heart Rate (bpm): 137 Fundal Height: 37 cm Movement: Present     General:  Alert, oriented and cooperative. Patient is in no acute distress.  Skin: Skin is warm and dry. No rash noted.   Cardiovascular: Normal heart rate noted  Respiratory: Normal respiratory effort, no problems with respiration noted  Abdomen: Soft, gravid, appropriate for gestational age.  Pain/Pressure: Present     Pelvic: Cervical exam performed        Extremities: Normal range of motion.  Edema: None  Mental Status: Normal mood and affect. Normal behavior. Normal judgment and thought content.   Assessment and Plan:  Pregnancy: G2P1001 at [redacted]w[redacted]d 1. Supervision of other normal pregnancy, antepartum  - Cervicovaginal ancillary only( [name]) - Culture, beta strep (group b only)  Patient refused glucose testing at 30 weeks. Glucose testing not done. Patient asking if there was an alternative option for determining gestational DM.  Discussed  patient with Dr. Earlene Plater. The patient was called in Strips, meter, and lancets. She was given printed instructions on how to check her BS and the times to check. She is instructed to write her BS down for one week and report them at her next visit. She voices understanding. Last US shows fetus @ 2500 grams.    Preterm labor symptoms and general obstetric precautions including but not limited to vaginal bleeding, contractions, leaking of fluid and fetal movement were reviewed in detail with the patient. Please refer to After Visit Summary for other counseling recommendations.   Return in about 1 week (around 06/30/2018) for virtual visit. .  Future Appointments  Date Time Provider Department Center  07/07/2018 10:15 AM , Harolyn Rutherford, NP [facility]    Venia Carbon, NP

## 2018-06-24 LAB — CERVICOVAGINAL ANCILLARY ONLY
Chlamydia: NEGATIVE
Neisseria Gonorrhea: NEGATIVE

## 2018-06-26 LAB — CULTURE, BETA STREP (GROUP B ONLY)
MICRO NUMBER:: 470791
SPECIMEN QUALITY:: ADEQUATE

## 2018-07-06 ENCOUNTER — Telehealth: Payer: Self-pay | Admitting: *Deleted

## 2018-07-06 NOTE — Telephone Encounter (Signed)
Patient wanted to be seen in the office today for lower back and right side pain also having off and on headaches. Patient told that there is no clinical staff here to see her today. If she can't wait for her scheduled appointment tomorrow to please be seen at [facility]. Patient was also advised of this on 07/05/2018 by the after hours triage line. Patient advised to increase her water intake to at least 72 oz. and rest off of her feet as much as possible. Patient agreed to all.

## 2018-07-07 ENCOUNTER — Other Ambulatory Visit: Payer: Self-pay

## 2018-07-07 ENCOUNTER — Ambulatory Visit (INDEPENDENT_AMBULATORY_CARE_PROVIDER_SITE_OTHER): Payer: Medicaid Other | Admitting: Obstetrics and Gynecology

## 2018-07-07 VITALS — BP 116/72 | HR 87 | Wt 238.0 lb

## 2018-07-07 DIAGNOSIS — Z3480 Encounter for supervision of other normal pregnancy, unspecified trimester: Secondary | ICD-10-CM

## 2018-07-07 DIAGNOSIS — O479 False labor, unspecified: Secondary | ICD-10-CM | POA: Insufficient documentation

## 2018-07-07 DIAGNOSIS — M549 Dorsalgia, unspecified: Secondary | ICD-10-CM | POA: Insufficient documentation

## 2018-07-07 DIAGNOSIS — O9989 Other specified diseases and conditions complicating pregnancy, childbirth and the puerperium: Secondary | ICD-10-CM

## 2018-07-07 DIAGNOSIS — O99891 Other specified diseases and conditions complicating pregnancy: Secondary | ICD-10-CM

## 2018-07-07 DIAGNOSIS — Z3A38 38 weeks gestation of pregnancy: Secondary | ICD-10-CM

## 2018-07-07 LAB — POCT URINALYSIS DIPSTICK OB: Glucose, UA: NEGATIVE

## 2018-07-07 MED ORDER — CYCLOBENZAPRINE HCL 10 MG PO TABS: 10.0000 mg | ORAL_TABLET | Freq: Three times a day (TID) | ORAL | 1 refills | Status: DC | PRN

## 2018-07-07 NOTE — Patient Instructions (Signed)
[facility]

## 2018-07-07 NOTE — Progress Notes (Signed)
   PRENATAL VISIT NOTE  Subjective:  Madison Guzman is a 23 y.o. G2P1001 at [redacted]w[redacted]d being seen today for ongoing prenatal care.  She is currently monitored for the following issues for this low-risk pregnancy and has Depression; Asthma, moderate persistent, well-controlled; Migraine without aura and without status migrainosus, not intractable; Anxiety state, unspecified; Insomnia; Chronic daily headache; Pseudotumor cerebri syndrome; Supervision of normal pregnancy; Obesity in pregnancy; Back pain in pregnancy; and Braxton Hicks contractions on their problem list.  Patient reports backache, migraine, right side pain. The side pain comes and goes. Feels like a stabbing pain that lasts for about 30 minutes. The pain is worse when she is active. The pain still comes when she sits however its better without movement.    Contractions: Irregular. Vag. Bleeding: None.  Movement: Present. Denies leaking of fluid.   The following portions of the patient's history were reviewed and updated as appropriate: allergies, current medications, past family history, past medical history, past social history, past surgical history and problem list.   Objective:   Vitals:   07/07/18 1020  BP: 116/72  Pulse: 87  Weight: 238 lb (108 kg)    Fetal Status: Fetal Heart Rate (bpm): 138 Fundal Height: 40 cm Movement: Present     General:  Alert, oriented and cooperative. Patient is in no acute distress.  Skin: Skin is warm and dry. No rash noted.   Cardiovascular: Normal heart rate noted  Respiratory: Normal respiratory effort, no problems with respiration noted  Abdomen: Soft, gravid, appropriate for gestational age.  Pain/Pressure: Present     Pelvic: Cervical exam performed Dilation: Closed Effacement (%): Thick Station: -3  Extremities: Normal range of motion.  Edema: Trace  Mental Status: Normal mood and affect. Normal behavior. Normal judgment and thought content.   Assessment and Plan:  Pregnancy: G2P1001  at [redacted]w[redacted]d 1. Supervision of other normal pregnancy, antepartum  - POC Urinalysis Dipstick OB - Patient refused glucose testing and opted to monitor glucose readings for 1 week. - continue low carb/sugar diet.       2. Back pain in pregnancy - POC Urinalysis Dipstick OB - Urinalysis, Complete  3. Braxton Hicks contractions  Back pain could be 2/2 to kidney stone. Cervix closed.  Flexeril RX, take with 1 gram tylenol OTC Increase oral fluid intake. If symptoms worsen go to MAU for further evaluation.   Term labor symptoms and general obstetric precautions including but not limited to vaginal bleeding, contractions, leaking of fluid and fetal movement were reviewed in detail with the patient. Please refer to After Visit Summary for other counseling recommendations.   Return in about 1 week (around 07/14/2018) for virtual visit, 2 weeks in person for NST.  Future Appointments  Date Time Provider Department Center  07/16/2018 11:00 AM Donette Larry, CNM [facility]  07/20/2018  1:30 PM Sharyon Cable, CNM [facility]    Venia Carbon, NP

## 2018-07-07 NOTE — Progress Notes (Signed)
Pt c/o back pain and migraines

## 2018-07-08 LAB — URINALYSIS, COMPLETE
Bilirubin Urine: NEGATIVE
Glucose, UA: NEGATIVE
Hgb urine dipstick: NEGATIVE
Hyaline Cast: NONE SEEN /LPF
Ketones, ur: NEGATIVE
Nitrite: NEGATIVE
Protein, ur: NEGATIVE
Specific Gravity, Urine: 1.021 (ref 1.001–1.03)
pH: 6.5 (ref 5.0–8.0)

## 2018-07-13 ENCOUNTER — Encounter: Payer: Medicaid Other | Admitting: Advanced Practice Midwife

## 2018-07-16 ENCOUNTER — Other Ambulatory Visit: Payer: Self-pay

## 2018-07-16 ENCOUNTER — Ambulatory Visit (INDEPENDENT_AMBULATORY_CARE_PROVIDER_SITE_OTHER): Payer: Medicaid Other | Admitting: Certified Nurse Midwife

## 2018-07-16 DIAGNOSIS — Z3400 Encounter for supervision of normal first pregnancy, unspecified trimester: Secondary | ICD-10-CM

## 2018-07-16 DIAGNOSIS — Z3A40 40 weeks gestation of pregnancy: Secondary | ICD-10-CM

## 2018-07-16 DIAGNOSIS — Z3403 Encounter for supervision of normal first pregnancy, third trimester: Secondary | ICD-10-CM

## 2018-07-16 NOTE — Progress Notes (Signed)
   TELEHEALTH OBSTETRICS PRENATAL VIRTUAL VIDEO VISIT ENCOUNTER NOTE  Provider location: [facility] [facility]   I connected with Madison Guzman on 07/16/18 at 11:00 AM EDT by WebEx Video Encounter at home and verified that I am speaking with the correct person using two identifiers.   I discussed the limitations, risks, security and privacy concerns of performing an evaluation and management service by telephone and the availability of in person appointments. I also discussed with the patient that there may be a patient responsible charge related to this service. The patient expressed understanding and agreed to proceed. Subjective:  Madison Guzman is a 23 y.o. G2P1001 at [redacted]w[redacted]d being seen today for ongoing prenatal care.  She is currently monitored for the following issues for this low-risk pregnancy and has Depression; Asthma, moderate persistent, well-controlled; Migraine without aura and without status migrainosus, not intractable; Anxiety state, unspecified; Insomnia; Chronic daily headache; Pseudotumor cerebri syndrome; Supervision of normal pregnancy; Obesity in pregnancy; Back pain in pregnancy; and Braxton Hicks contractions on their problem list.  Patient reports backache.  Contractions: Irregular. Vag. Bleeding: None.  Movement: Present. Denies any leaking of fluid.   The following portions of the patient's history were reviewed and updated as appropriate: allergies, current medications, past family history, past medical history, past social history, past surgical history and problem list.   Objective:   Vitals:   07/16/18 1107  BP: 121/78  Pulse: 91    Fetal Status:     Movement: Present     General:  Alert, oriented and cooperative. Patient is in no acute distress.  Respiratory: Normal respiratory effort, no problems with respiration noted  Mental Status: Normal mood and affect. Normal behavior. Normal judgment and thought content.  Rest of physical  exam deferred due to type of encounter  Imaging: No results found.  Assessment and Plan:  Pregnancy: G2P1001 at [redacted]w[redacted]d 1. Supervision of normal first pregnancy, antepartum - NST/AFI next week - IOL at 41 wks (prefers later in 41st week)  2. Low back pain in pregnancy - maternity support belt (piscked up from office) - has Flexeril  Term labor symptoms and general obstetric precautions including but not limited to vaginal bleeding, contractions, leaking of fluid and fetal movement were reviewed in detail with the patient. I discussed the assessment and treatment plan with the patient. The patient was provided an opportunity to ask questions and all were answered. The patient agreed with the plan and demonstrated an understanding of the instructions. The patient was advised to call back or seek an in-person office evaluation/go to MAU at [facility] for any urgent or concerning symptoms. Please refer to After Visit Summary for other counseling recommendations.   I provided 12 minutes of face-to-face time during this encounter.  No follow-ups on file.  Future Appointments  Date Time Provider Department Center  07/20/2018  1:30 PM Sharyon Cable, CNM [facility]    Donette Larry, CNM [facility] [employer]

## 2018-07-20 ENCOUNTER — Ambulatory Visit (INDEPENDENT_AMBULATORY_CARE_PROVIDER_SITE_OTHER): Payer: Medicaid Other | Admitting: Certified Nurse Midwife

## 2018-07-20 ENCOUNTER — Other Ambulatory Visit: Payer: Self-pay

## 2018-07-20 VITALS — BP 119/65 | HR 50 | Wt 240.0 lb

## 2018-07-20 DIAGNOSIS — O99213 Obesity complicating pregnancy, third trimester: Secondary | ICD-10-CM

## 2018-07-20 DIAGNOSIS — O99210 Obesity complicating pregnancy, unspecified trimester: Secondary | ICD-10-CM

## 2018-07-20 DIAGNOSIS — Z3483 Encounter for supervision of other normal pregnancy, third trimester: Secondary | ICD-10-CM

## 2018-07-20 DIAGNOSIS — Z3A40 40 weeks gestation of pregnancy: Secondary | ICD-10-CM | POA: Diagnosis not present

## 2018-07-20 DIAGNOSIS — O480 Post-term pregnancy: Secondary | ICD-10-CM | POA: Diagnosis not present

## 2018-07-20 NOTE — Patient Instructions (Signed)
Labor Induction    Labor induction is when steps are taken to cause a pregnant woman to begin the labor process. Most women go into labor on their own between 37 weeks and 42 weeks of pregnancy. When this does not happen or when there is a medical need for labor to begin, steps may be taken to induce labor. Labor induction causes a pregnant woman's uterus to contract. It also causes the cervix to soften (ripen), open (dilate), and thin out (efface). Usually, labor is not induced before 39 weeks of pregnancy unless there is a medical reason to do so. Your health care provider will determine if labor induction is needed.  Before inducing labor, your health care provider will consider a number of factors, including:  · Your medical condition and your baby's.  · How many weeks along you are in your pregnancy.  · How mature your baby's lungs are.  · The condition of your cervix.  · The position of your baby.  · The size of your birth canal.  What are some reasons for labor induction?  Labor may be induced if:  · Your health or your baby's health is at risk.  · Your pregnancy is overdue by 1 week or more.  · Your water breaks but labor does not start on its own.  · There is a low amount of amniotic fluid around your baby.  You may also choose (elect) to have labor induced at a certain time. Generally, elective labor induction is done no earlier than 39 weeks of pregnancy.  What methods are used for labor induction?  Methods used for labor induction include:  · Prostaglandin medicine. This medicine starts contractions and causes the cervix to dilate and ripen. It can be taken by mouth (orally) or by being inserted into the vagina (suppository).  · Inserting a small, thin tube (catheter) with a balloon into the vagina and then expanding the balloon with water to dilate the cervix.  · Stripping the membranes. In this method, your health care provider gently separates amniotic sac tissue from the cervix. This causes the  cervix to stretch, which in turn causes the release of a hormone called progesterone. The hormone causes the uterus to contract. This procedure is often done during an office visit, after which you will be sent home to wait for contractions to begin.  · Breaking the water. In this method, your health care provider uses a small instrument to make a small hole in the amniotic sac. This eventually causes the amniotic sac to break. Contractions should begin after a few hours.  · Medicine to trigger or strengthen contractions. This medicine is given through an IV that is inserted into a vein in your arm.  Except for membrane stripping, which can be done in a clinic, labor induction is done in the hospital so that you and your baby can be carefully monitored.  How long does it take for labor to be induced?  The length of time it takes to induce labor depends on how ready your body is for labor. Some inductions can take up to 2-3 days, while others may take less than a day. Induction may take longer if:  · You are induced early in your pregnancy.  · It is your first pregnancy.  · Your cervix is not ready.  What are some risks associated with labor induction?  Some risks associated with labor induction include:  · Changes in fetal heart rate, such as being too   high, too low, or irregular (erratic).  · Failed induction.  · Infection in the mother or the baby.  · Increased risk of having a cesarean delivery.  · Fetal death.  · Breaking off (abruption) of the placenta from the uterus (rare).  · Rupture of the uterus (very rare).  When induction is needed for medical reasons, the benefits of induction generally outweigh the risks.  What are some reasons for not inducing labor?  Labor induction should not be done if:  · Your baby does not tolerate contractions.  · You have had previous surgeries on your uterus, such as a myomectomy, removal of fibroids, or a vertical scar from a previous cesarean delivery.  · Your placenta lies  very low in your uterus and blocks the opening of the cervix (placenta previa).  · Your baby is not in a head-down position.  · The umbilical cord drops down into the birth canal in front of the baby.  · There are unusual circumstances, such as the baby being very early (premature).  · You have had more than 2 previous cesarean deliveries.  Summary  · Labor induction is when steps are taken to cause a pregnant woman to begin the labor process.  · Labor induction causes a pregnant woman's uterus to contract. It also causes the cervix to ripen, dilate, and efface.  · Labor is not induced before 39 weeks of pregnancy unless there is a medical reason to do so.  · When induction is needed for medical reasons, the benefits of induction generally outweigh the risks.  This information is not intended to replace advice given to you by your health care provider. Make sure you discuss any questions you have with your health care provider.  Document Released: 06/18/2006 Document Revised: 03/12/2016 Document Reviewed: 03/12/2016  Elsevier Interactive Patient Education © 2019 Else[employer] Inc.

## 2018-07-20 NOTE — Progress Notes (Signed)
   PRENATAL VISIT NOTE  Subjective:  Madison Guzman is a 23 y.o. G2P1001 at [redacted]w[redacted]d being seen today for ongoing prenatal care.  She is currently monitored for the following issues for this low-risk pregnancy and has Depression; Asthma, moderate persistent, well-controlled; Migraine without aura and without status migrainosus, not intractable; Anxiety state, unspecified; Insomnia; Chronic daily headache; Pseudotumor cerebri syndrome; Supervision of normal pregnancy; Obesity in pregnancy; Back pain in pregnancy; and Braxton Hicks contractions on their problem list.  Patient reports occasional contractions.  Contractions: Irregular. Vag. Bleeding: None.  Movement: Present. Denies leaking of fluid.   The following portions of the patient's history were reviewed and updated as appropriate: allergies, current medications, past family history, past medical history, past social history, past surgical history and problem list.   Objective:   Vitals:   07/20/18 1341  BP: 119/65  Pulse: (!) 50  Weight: 240 lb (108.9 kg)    Fetal Status: Fetal Heart Rate (bpm): NST-R Fundal Height: 40 cm Movement: Present  Presentation: Vertex  General:  Alert, oriented and cooperative. Patient is in no acute distress.  Skin: Skin is warm and dry. No rash noted.   Cardiovascular: Normal heart rate noted  Respiratory: Normal respiratory effort, no problems with respiration noted  Abdomen: Soft, gravid, appropriate for gestational age.  Pain/Pressure: Present     Pelvic: Cervical exam performed Dilation: 2 Effacement (%): Thick Station: -3  Extremities: Normal range of motion.  Edema: Trace  Mental Status: Normal mood and affect. Normal behavior. Normal judgment and thought content.   Assessment and Plan:  Pregnancy: G2P1001 at [redacted]w[redacted]d 1. Post-term pregnancy, 40-42 weeks of gestation - Discussed IOL with patient, patient does not want to be induced at 41 weeks wants to wait closer to end of 41 weeks  - Educated on  risks of post dates pregnancy and age of placenta, encouraged need for 2x weekly appointments until delivery, patient verbalizes understanding  - Educated on EPO, RRT, and IC to help induce contractions d/t wanting to go into labor naturally  - IOL scheduled for 6/17 @ [facility], orders placed   2. Encounter for supervision of other normal pregnancy in third trimester - Post dates antenatal screening, NST reactive in office today and AFI 10.73 - Membranes swept  - Anticipatory guidance on upcoming appointments   3. Obesity in pregnancy  Term labor symptoms and general obstetric precautions including but not limited to vaginal bleeding, contractions, leaking of fluid and fetal movement were reviewed in detail with the patient. Please refer to After Visit Summary for other counseling recommendations.   Return in about 2 days (around 07/22/2018) for ROB, NST.  Future Appointments  Date Time Provider [place]  07/22/2018  2:30 PM Emily Filbert, MD [facility]  07/26/2018 10:45 AM Emily Filbert, MD [facility]  07/28/2018 12:00 AM [facility] [place] None    Lajean Manes, CNM

## 2018-07-21 ENCOUNTER — Encounter (HOSPITAL_COMMUNITY): Payer: Self-pay | Admitting: *Deleted

## 2018-07-21 ENCOUNTER — Other Ambulatory Visit: Payer: Self-pay | Admitting: Family Medicine

## 2018-07-21 ENCOUNTER — Telehealth (HOSPITAL_COMMUNITY): Payer: Self-pay | Admitting: *Deleted

## 2018-07-21 NOTE — Telephone Encounter (Signed)
Preadmission screen  

## 2018-07-22 ENCOUNTER — Ambulatory Visit (INDEPENDENT_AMBULATORY_CARE_PROVIDER_SITE_OTHER): Payer: Medicaid Other | Admitting: Obstetrics & Gynecology

## 2018-07-22 ENCOUNTER — Other Ambulatory Visit: Payer: Self-pay

## 2018-07-22 VITALS — BP 121/79 | HR 92 | Wt 240.0 lb

## 2018-07-22 DIAGNOSIS — O99210 Obesity complicating pregnancy, unspecified trimester: Secondary | ICD-10-CM

## 2018-07-22 DIAGNOSIS — Z3A41 41 weeks gestation of pregnancy: Secondary | ICD-10-CM

## 2018-07-22 DIAGNOSIS — O99213 Obesity complicating pregnancy, third trimester: Secondary | ICD-10-CM | POA: Diagnosis not present

## 2018-07-22 DIAGNOSIS — Z3483 Encounter for supervision of other normal pregnancy, third trimester: Secondary | ICD-10-CM

## 2018-07-23 NOTE — Progress Notes (Signed)
   PRENATAL VISIT NOTE  Subjective:  Madison Guzman is a 23 y.o. G2P1001 at [redacted]w[redacted]d being seen today for ongoing prenatal care.  She is currently monitored for the following issues for this low-risk pregnancy and has Depression; Asthma, moderate persistent, well-controlled; Migraine without aura and without status migrainosus, not intractable; Anxiety state, unspecified; Insomnia; Chronic daily headache; Pseudotumor cerebri syndrome; Supervision of normal pregnancy; Obesity in pregnancy; Back pain in pregnancy; and Braxton Hicks contractions on their problem list.  Patient reports no complaints.  Contractions: Irritability. Vag. Bleeding: None.  Movement: Present. Denies leaking of fluid.   The following portions of the patient's history were reviewed and updated as appropriate: allergies, current medications, past family history, past medical history, past social history, past surgical history and problem list.   Objective:   Vitals:   07/22/18 1439  BP: 121/79  Pulse: 92  Weight: 240 lb (108.9 kg)    Fetal Status:     Movement: Present  Presentation: Vertex  General:  Alert, oriented and cooperative. Patient is in no acute distress.  Skin: Skin is warm and dry. No rash noted.   Cardiovascular: Normal heart rate noted  Respiratory: Normal respiratory effort, no problems with respiration noted  Abdomen: Soft, gravid, appropriate for gestational age.  Pain/Pressure: Present     Pelvic: Cervical exam performed Dilation: 2.5 Effacement (%): Thick    Extremities: Normal range of motion.  Edema: Trace  Mental Status: Normal mood and affect. Normal behavior. Normal judgment and thought content.   Assessment and Plan:  Pregnancy: G2P1001 at [redacted]w[redacted]d 1. Obesity in pregnancy  2. Encounter for supervision of other normal pregnancy in third trimester - IOL next week, date of her choice because of an impending brother's graduation  Term labor symptoms and general obstetric precautions including  but not limited to vaginal bleeding, contractions, leaking of fluid and fetal movement were reviewed in detail with the patient. Please refer to After Visit Summary for other counseling recommendations.   Return for keep appt Monday for NST, then pp visit in 4 weeks (virtual).  Future Appointments  Date Time Provider [place]  07/26/2018  9:40 AM [facility] 1 [facility] None  07/26/2018 10:45 AM Emily Filbert, MD [facility]  07/28/2018 12:00 AM [facility] [place] [facility] None  08/26/2018  1:15 PM Emily Filbert, MD [facility]    Emily Filbert, MD

## 2018-07-25 ENCOUNTER — Inpatient Hospital Stay (HOSPITAL_COMMUNITY)
Admission: AD | Admit: 2018-07-25 | Discharge: 2018-07-26 | DRG: 807 | Disposition: A | Discharge status: Home / Self Care | Payer: Medicaid Other | Attending: Family Medicine | Admitting: Family Medicine

## 2018-07-25 ENCOUNTER — Encounter (HOSPITAL_COMMUNITY): Payer: Self-pay

## 2018-07-25 ENCOUNTER — Other Ambulatory Visit: Payer: Self-pay

## 2018-07-25 DIAGNOSIS — O9089 Other complications of the puerperium, not elsewhere classified: Secondary | ICD-10-CM | POA: Diagnosis not present

## 2018-07-25 DIAGNOSIS — F329 Major depressive disorder, single episode, unspecified: Secondary | ICD-10-CM | POA: Diagnosis present

## 2018-07-25 DIAGNOSIS — O99214 Obesity complicating childbirth: Secondary | ICD-10-CM | POA: Diagnosis present

## 2018-07-25 DIAGNOSIS — O480 Post-term pregnancy: Secondary | ICD-10-CM | POA: Diagnosis not present

## 2018-07-25 DIAGNOSIS — O9952 Diseases of the respiratory system complicating childbirth: Secondary | ICD-10-CM | POA: Diagnosis present

## 2018-07-25 DIAGNOSIS — J4540 Moderate persistent asthma, uncomplicated: Secondary | ICD-10-CM | POA: Diagnosis present

## 2018-07-25 DIAGNOSIS — R339 Retention of urine, unspecified: Secondary | ICD-10-CM | POA: Diagnosis not present

## 2018-07-25 DIAGNOSIS — O99210 Obesity complicating pregnancy, unspecified trimester: Secondary | ICD-10-CM | POA: Diagnosis present

## 2018-07-25 DIAGNOSIS — Z3A41 41 weeks gestation of pregnancy: Secondary | ICD-10-CM

## 2018-07-25 DIAGNOSIS — F32A Depression, unspecified: Secondary | ICD-10-CM | POA: Diagnosis present

## 2018-07-25 DIAGNOSIS — E669 Obesity, unspecified: Secondary | ICD-10-CM | POA: Diagnosis present

## 2018-07-25 DIAGNOSIS — Z7722 Contact with and (suspected) exposure to environmental tobacco smoke (acute) (chronic): Secondary | ICD-10-CM | POA: Diagnosis present

## 2018-07-25 DIAGNOSIS — Z1159 Encounter for screening for other viral diseases: Secondary | ICD-10-CM

## 2018-07-25 DIAGNOSIS — R519 Headache, unspecified: Secondary | ICD-10-CM | POA: Diagnosis present

## 2018-07-25 DIAGNOSIS — Z3490 Encounter for supervision of normal pregnancy, unspecified, unspecified trimester: Secondary | ICD-10-CM | POA: Diagnosis present

## 2018-07-25 DIAGNOSIS — G43009 Migraine without aura, not intractable, without status migrainosus: Secondary | ICD-10-CM | POA: Diagnosis present

## 2018-07-25 HISTORY — DX: Anxiety disorder, unspecified: F41.9

## 2018-07-25 LAB — CBC
HCT: 36.5 % (ref 36.0–46.0)
Hemoglobin: 12 g/dL (ref 12.0–15.0)
MCH: 27.1 pg (ref 26.0–34.0)
MCHC: 32.9 g/dL (ref 30.0–36.0)
MCV: 82.4 fL (ref 80.0–100.0)
Platelets: 276 10*3/uL (ref 150–400)
RBC: 4.43 MIL/uL (ref 3.87–5.11)
RDW: 14.3 % (ref 11.5–15.5)
WBC: 14.5 10*3/uL — ABNORMAL HIGH (ref 4.0–10.5)
nRBC: 0 % (ref 0.0–0.2)

## 2018-07-25 LAB — TYPE AND SCREEN
ABO/RH(D): A POS
Antibody Screen: NEGATIVE

## 2018-07-25 LAB — ABO/RH: ABO/RH(D): A POS

## 2018-07-25 LAB — SARS CORONAVIRUS 2: SARS Coronavirus 2: NOT DETECTED

## 2018-07-25 MED ORDER — OXYTOCIN BOLUS FROM INFUSION: 500.0000 mL | Freq: Once | INTRAVENOUS | Status: DC

## 2018-07-25 MED ORDER — FENTANYL CITRATE (PF) 100 MCG/2ML IJ SOLN
100.0000 ug | INTRAMUSCULAR | Status: DC | PRN
  Administered 2018-07-25: 100 ug via INTRAVENOUS
  Filled 2018-07-25: qty 2

## 2018-07-25 MED ORDER — COCONUT OIL OIL
1.0000 "application " | TOPICAL_OIL | Status: DC | PRN
  Administered 2018-07-26: 1 via TOPICAL

## 2018-07-25 MED ORDER — ONDANSETRON HCL 4 MG PO TABS: 4.0000 mg | ORAL_TABLET | ORAL | Status: DC | PRN

## 2018-07-25 MED ORDER — LACTATED RINGERS IV SOLN: INTRAVENOUS | Status: DC

## 2018-07-25 MED ORDER — ALPRAZOLAM 0.5 MG PO TABS
0.5000 mg | ORAL_TABLET | Freq: Once | ORAL | Status: AC
  Administered 2018-07-25: 0.5 mg via ORAL

## 2018-07-25 MED ORDER — FENTANYL-BUPIVACAINE-NACL 0.5-0.125-0.9 MG/250ML-% EP SOLN
12.0000 mL/h | EPIDURAL | Status: DC | PRN
  Filled 2018-07-25: qty 250

## 2018-07-25 MED ORDER — OXYTOCIN 40 UNITS IN NORMAL SALINE INFUSION - SIMPLE MED: 2.5000 [IU]/h | INTRAVENOUS | Status: DC

## 2018-07-25 MED ORDER — LIDOCAINE HCL (PF) 1 % IJ SOLN
30.0000 mL | INTRAMUSCULAR | Status: DC | PRN
  Administered 2018-07-25: 30 mL via SUBCUTANEOUS
  Filled 2018-07-25: qty 30

## 2018-07-25 MED ORDER — FUROSEMIDE 20 MG PO TABS
20.0000 mg | ORAL_TABLET | Freq: Once | ORAL | Status: AC
  Administered 2018-07-26: 20 mg via ORAL
  Filled 2018-07-25: qty 1

## 2018-07-25 MED ORDER — EPHEDRINE 5 MG/ML INJ: 10.0000 mg | INTRAVENOUS | Status: DC | PRN

## 2018-07-25 MED ORDER — BENZOCAINE-MENTHOL 20-0.5 % EX AERO
1.0000 "application " | INHALATION_SPRAY | CUTANEOUS | Status: DC | PRN
  Filled 2018-07-25: qty 56

## 2018-07-25 MED ORDER — LIDOCAINE HCL (PF) 1 % IJ SOLN: 30.0000 mL | INTRAMUSCULAR | Status: DC | PRN

## 2018-07-25 MED ORDER — IBUPROFEN 600 MG PO TABS
600.0000 mg | ORAL_TABLET | Freq: Four times a day (QID) | ORAL | Status: DC
  Administered 2018-07-25 – 2018-07-26 (×5): 600 mg via ORAL
  Filled 2018-07-25 (×5): qty 1

## 2018-07-25 MED ORDER — PHENYLEPHRINE 40 MCG/ML (10ML) SYRINGE FOR IV PUSH (FOR BLOOD PRESSURE SUPPORT): 80.0000 ug | PREFILLED_SYRINGE | INTRAVENOUS | Status: DC | PRN

## 2018-07-25 MED ORDER — OXYCODONE-ACETAMINOPHEN 5-325 MG PO TABS: 2.0000 | ORAL_TABLET | ORAL | Status: DC | PRN

## 2018-07-25 MED ORDER — OXYCODONE-ACETAMINOPHEN 5-325 MG PO TABS: 1.0000 | ORAL_TABLET | ORAL | Status: DC | PRN

## 2018-07-25 MED ORDER — ACETAMINOPHEN 325 MG PO TABS: 650.0000 mg | ORAL_TABLET | ORAL | Status: DC | PRN

## 2018-07-25 MED ORDER — TETANUS-DIPHTH-ACELL PERTUSSIS 5-2.5-18.5 LF-MCG/0.5 IM SUSP: 0.5000 mL | Freq: Once | INTRAMUSCULAR | Status: DC

## 2018-07-25 MED ORDER — ACETAMINOPHEN 325 MG PO TABS
650.0000 mg | ORAL_TABLET | ORAL | Status: DC | PRN
  Administered 2018-07-25 – 2018-07-26 (×3): 650 mg via ORAL
  Filled 2018-07-25 (×3): qty 2

## 2018-07-25 MED ORDER — SOD CITRATE-CITRIC ACID 500-334 MG/5ML PO SOLN: 30.0000 mL | ORAL | Status: DC | PRN

## 2018-07-25 MED ORDER — LACTATED RINGERS IV SOLN: 500.0000 mL | INTRAVENOUS | Status: DC | PRN

## 2018-07-25 MED ORDER — ONDANSETRON HCL 4 MG/2ML IJ SOLN
4.0000 mg | Freq: Four times a day (QID) | INTRAMUSCULAR | Status: DC | PRN
  Administered 2018-07-25: 4 mg via INTRAVENOUS
  Filled 2018-07-25: qty 2

## 2018-07-25 MED ORDER — ALPRAZOLAM 1 MG PO TABS: 1.0000 mg | ORAL_TABLET | Freq: Once | ORAL | Status: DC

## 2018-07-25 MED ORDER — ALPRAZOLAM 0.5 MG PO TABS
1.0000 mg | ORAL_TABLET | ORAL | Status: DC
  Filled 2018-07-25: qty 2

## 2018-07-25 MED ORDER — DIBUCAINE (PERIANAL) 1 % EX OINT: 1.0000 "application " | TOPICAL_OINTMENT | CUTANEOUS | Status: DC | PRN

## 2018-07-25 MED ORDER — SIMETHICONE 80 MG PO CHEW: 80.0000 mg | CHEWABLE_TABLET | ORAL | Status: DC | PRN

## 2018-07-25 MED ORDER — ONDANSETRON HCL 4 MG/2ML IJ SOLN: 4.0000 mg | Freq: Four times a day (QID) | INTRAMUSCULAR | Status: DC | PRN

## 2018-07-25 MED ORDER — LACTATED RINGERS IV SOLN: 500.0000 mL | Freq: Once | INTRAVENOUS | Status: DC

## 2018-07-25 MED ORDER — TERBUTALINE SULFATE 1 MG/ML IJ SOLN: 0.2500 mg | Freq: Once | INTRAMUSCULAR | Status: DC | PRN

## 2018-07-25 MED ORDER — SENNOSIDES-DOCUSATE SODIUM 8.6-50 MG PO TABS
2.0000 | ORAL_TABLET | ORAL | Status: DC
  Administered 2018-07-26: 2 via ORAL
  Filled 2018-07-25: qty 2

## 2018-07-25 MED ORDER — DIPHENHYDRAMINE HCL 25 MG PO CAPS: 25.0000 mg | ORAL_CAPSULE | Freq: Four times a day (QID) | ORAL | Status: DC | PRN

## 2018-07-25 MED ORDER — PRENATAL MULTIVITAMIN CH
1.0000 | ORAL_TABLET | Freq: Every day | ORAL | Status: DC
  Administered 2018-07-25: 1 via ORAL
  Filled 2018-07-25: qty 1

## 2018-07-25 MED ORDER — WITCH HAZEL-GLYCERIN EX PADS: 1.0000 "application " | MEDICATED_PAD | CUTANEOUS | Status: DC | PRN

## 2018-07-25 MED ORDER — ONDANSETRON HCL 4 MG/2ML IJ SOLN: 4.0000 mg | INTRAMUSCULAR | Status: DC | PRN

## 2018-07-25 MED ORDER — DIPHENHYDRAMINE HCL 50 MG/ML IJ SOLN: 12.5000 mg | INTRAMUSCULAR | Status: DC | PRN

## 2018-07-25 MED ORDER — OXYTOCIN 10 UNIT/ML IJ SOLN
INTRAMUSCULAR | Status: AC
  Administered 2018-07-25: 10 [IU]
  Filled 2018-07-25: qty 1

## 2018-07-25 MED ORDER — OXYTOCIN 40 UNITS IN NORMAL SALINE INFUSION - SIMPLE MED: 1.0000 m[IU]/min | INTRAVENOUS | Status: DC

## 2018-07-25 NOTE — Progress Notes (Signed)
Walked with patient to rest room. She said she had trickle of urine, but was unable to really void. Encourage patient to drink fluids

## 2018-07-25 NOTE — Progress Notes (Addendum)
Bladder scanned patient; 310 ml.  Patient has been encouraged to drink but has "not been drinking a whole lot."  When patient attempted to void an hour ago, she said that only a trickle came out, with pushing. Grant doctor.  Notified Madelyn Flavors, CNM about patient (time:2230).   She will confer with colleague and come to speak to patient.

## 2018-07-25 NOTE — H&P (Signed)
LABOR AND DELIVERY ADMISSION HISTORY AND PHYSICAL NOTE  EVIE CROSTON is a 23 y.o. female G2P1001 with IUP at [redacted]w[redacted]d by first trimester sono presenting for SOL.  She reports positive fetal movement. She denies leakage of fluid or vaginal bleeding.  Prenatal History/Complications: PNC at [facility] Pregnancy complications:  - h/o asthma, no meds  - chronic migraines  - late [facility], established at 19 weeks  - depression and anxiety, no meds   Past Medical History: Past Medical History:  Diagnosis Date  . Asthma   . Depression   . Migraine    Pt reports since age of 43  . Pharyngitis 01/08/2013   [facility] - strep neg    Past Surgical History: Past Surgical History:  Procedure Laterality Date  . CHOLECYSTECTOMY    . TYMPANOSTOMY TUBE PLACEMENT Bilateral     Obstetrical History: OB History    Gravida  2   Para  1   Term  1   Preterm  0   AB  0   Living  1     SAB  0   TAB  0   Ectopic  0   Multiple  0   Live Births  1           Social History: Social History   Socioeconomic History  . Marital status: Single    Spouse name: Not on file  . Number of children: Not on file  . Years of education: Not on file  . Highest education level: Not on file  Occupational History  . Not on file  Social Needs  . Financial resource strain: Not hard at all  . Food insecurity    Worry: Never true    Inability: Never true  . Transportation needs    Medical: No    Non-medical: Not on file  Tobacco Use  . Smoking status: Passive Smoke Exposure - Never Smoker  . Smokeless tobacco: Never Used  Substance and Sexual Activity  . Alcohol use: No    Alcohol/week: 0.0 standard drinks  . Drug use: No  . Sexual activity: Not Currently    Birth control/protection: None    Comment: Patient stated that she does not always take the [facility] pill as prescribed 04/12/14 TW  Lifestyle  . Physical activity    Days per week: Not on file    Minutes per session: Not on file  .  Stress: Rather much  Relationships  . Social Herbalist on phone: Not on file    Gets together: Not on file    Attends religious service: Not on file    Active member of club or organization: Not on file    Attends meetings of clubs or organizations: Not on file    Relationship status: Not on file  Other Topics Concern  . Not on file  Social History Narrative   Aaliyah attends [employer]. She is doing well.   Generally stays at maternal grandmothers home across from parents.  Has her own room there and the 2 households interact well.    Family History: Family History  Problem Relation Age of Onset  . Asthma Mother   . Hypertension Mother   . Diabetes Maternal Grandmother   . Hyperlipidemia Maternal Grandmother   . Hypertension Maternal Grandmother   . Mental illness Maternal Grandmother   . Migraines Maternal Grandmother   . Seizures Father   . ADD / ADHD Brother  1 Younger brother has ADHD  . Mental illness Maternal Aunt        Bipolar  . ADD / ADHD Maternal Uncle     Allergies: No Known Allergies  Medications Prior to Admission  Medication Sig Dispense Refill Last Dose  . Accu-Chek FastClix Lancets MISC 1 Device by Percutaneous route 4 (four) times daily. (Patient not taking: Reported on 07/07/2018) 100 each 12   . cyclobenzaprine (FLEXERIL) 10 MG tablet Take 1 tablet (10 mg total) by mouth every 8 (eight) hours as needed for muscle spasms. 30 tablet 1   . glucose blood (ACCU-CHEK GUIDE) test strip Use as instructed, 4 times a day (Patient not taking: Reported on 07/07/2018) 100 each 12      Review of Systems  All systems reviewed and negative except as stated in HPI  Physical Exam Last menstrual period 10/09/2017, currently breastfeeding. General appearance: alert, oriented, NAD Lungs: normal respiratory effort Heart: regular rate Abdomen: soft, non-tender; gravid, FH appropriate for GA Extremities: No calf swelling or  tenderness Presentation: cephalic Fetal monitoring: 150 bpm, moderate variability, +acels, no decels  Uterine activity: q2-3 min  Dilation: Lip/rim Effacement (%): 100 Station: -1 Exam by:: Dr Sheran Fava Wallace  Prenatal labs: ABO, Rh: --/--/PENDING (06/14 0409) Antibody: PENDING (06/14 0409) Rubella: 1.08 (03/16 0933) RPR: NON-REACTIVE (03/16 0933)  HBsAg: NON-REACTIVE (03/16 0933)  HIV: NON-REACTIVE (03/16 0933)  GC/Chlamydia: Negative  GBS:   Negative  2-hr GTT: Declined  Genetic screening:  Declined  Anatomy US: Normal   Prenatal Transfer Tool  Maternal Diabetes: No Genetic Screening: Declined Maternal Ultrasounds/Referrals: Normal Fetal Ultrasounds or other Referrals:  None Maternal Substance Abuse:  No Significant Maternal Medications:  None Significant Maternal Lab Results: Lab values include: Group B Strep negative  Results for orders placed or performed during the hospital encounter of 07/25/18 (from the past 24 hour(s))  CBC   Collection Time: 07/25/18  4:09 AM  Result Value Ref Range   WBC 14.5 (H) 4.0 - 10.5 K/uL   RBC 4.43 3.87 - 5.11 MIL/uL   Hemoglobin 12.0 12.0 - 15.0 g/dL   HCT 78.236.5 95.636.0 - 21.346.0 %   MCV 82.4 80.0 - 100.0 fL   MCH 27.1 26.0 - 34.0 pg   MCHC 32.9 30.0 - 36.0 g/dL   RDW 08.614.3 57.811.5 - 46.915.5 %   Platelets 276 150 - 400 K/uL   nRBC 0.0 0.0 - 0.2 %  Type and screen [facility]   Collection Time: 07/25/18  4:09 AM  Result Value Ref Range   ABO/RH(D) PENDING    Antibody Screen PENDING    Sample Expiration      07/28/2018,2359 Performed at [facility], [address] [place], [place] [address]     Patient Active Problem List   Diagnosis Date Noted  . Indication for care in labor or delivery 07/25/2018  . Encounter for induction of labor 07/25/2018  . Back pain in pregnancy 07/07/2018  . Braxton Hicks contractions 07/07/2018  . Obesity in pregnancy 06/03/2016  . Supervision of normal pregnancy 03/11/2016  .  Pseudotumor cerebri syndrome 09/22/2014  . Chronic daily headache 06/16/2013  . Migraine without aura and without status migrainosus, not intractable 12/01/2012  . Anxiety state, unspecified 12/01/2012  . Insomnia 12/01/2012  . Depression 09/16/2012  . Asthma, moderate persistent, well-controlled 09/16/2012    Assessment: Lazarus Salinesnniya M Argote is a 23 y.o. G2P1001 at 8167w2d here for active labor.   #Labor: Expectant management.  #Pain: Maternal support  #  FWB: Cat I  #ID:  GBS neg  #MOF: Breast  #MOC:Undecided  #Circ:  [place]   Catherine L Wallace 07/25/2018, 5:10 AM

## 2018-07-25 NOTE — Lactation Note (Signed)
This note was copied from a baby's chart. Lactation Consultation Note  Patient Name: Madison Guzman [id number] Date: 07/25/2018 Reason for consult: Initial assessment;Term  2115 - I visited [name] to conduct basic breast feeding education and to assist with breast feeding. We reviewed breast feeding basics including feeding cues, feeding frequency and duration, output expectations and day 1 and day 2 infant feeding patterns. I also gave [name] our breast feeding community support resources.   [name] has some prior experience breast feeding. Her daughter began life in a NICU; while she did breast feed some, [name] states that she preferred a bottle. Mom pumped and bottle fed with some breast feeding as a result.  [name] has two used personal pumps at home. She does have [place], and we discussed their support options.   We attempted to feed baby Eulas Post in cross cradle position on mom's right breast. Her nipples are evert and WNL. [name] showed good technique; waking measures, hand express milk onto the nipple, and tickle baby's upper lip with her nipple. He was too sleepy to wake and eat. I recommended that she try again in an hour and continue to monitor for feeding cues. I also recommended that she do some hand expression.  Maternal Data Formula Feeding for Exclusion: No Has patient been taught Hand Expression?: Yes Does the patient have breastfeeding experience prior to this delivery?: Yes   LATCH Score Latch: Too sleepy or reluctant, no latch achieved, no sucking elicited.  Audible Swallowing: None  Type of Nipple: Everted at rest and after stimulation  Comfort (Breast/Nipple): Soft / non-tender  Hold (Positioning): No assistance needed to correctly position infant at breast.  LATCH Score: 6  Interventions Interventions: Breast feeding basics reviewed;Skin to skin;Hand express;Breast compression(gentle waking measures)  Lactation Tools Discussed/Used WIC Program:  Yes   Consult Status Consult Status: Follow-up Date: 07/26/18    Madison Guzman 07/25/2018, 9:34 PM

## 2018-07-25 NOTE — Progress Notes (Signed)
Patient encouraged to void at shift change; Still had not voided at 2000.

## 2018-07-25 NOTE — Progress Notes (Signed)
Called  Dr. Veva Holes, verbal order to in and out cath patient. Urine output was 1076ml

## 2018-07-25 NOTE — Progress Notes (Signed)
Bladder Scanned pt. 465ml noted on scanner. Encouraged patient to drink more fluids. Will attempt to urinate in 30 minutes. Will notify provider if patient is unable to void at that time.

## 2018-07-25 NOTE — MAU Note (Signed)
Pt presented to MAU c/o ctx every 1-2 min. No bleeding or LOF. +FM. Pt was checked when she came to room 120 pt was 6/100/-1/Vertex with a BBOW. Dr. Phill Myron notified and L&D charge nurse notified. Pt was transferred to L&D. FHR was 140 upon arrival.

## 2018-07-25 NOTE — Progress Notes (Signed)
In to see patient about urinary retention, patient believes she is being "punished by being forced to drink and pee then not be able to go home"   Educated and discussed with patient that she will not be able to be discharged in the morning like she wants if she does not urinate spontaneously or urinary retention continues.   Patient has not hydrating appropriately since retention, encouraged to drink pitcher of water given, sit in bathroom for 15-20 minutes on toilet with water running and bear down.   1 dose of Lasix ordered d/t breastfeeding and unable to give Pyridium. If unable to spontaneously urinate by 0100, will insert foley catheter, patient verbalizes understanding, patient is upset and crying through discussion.    Lajean Manes, CNM 07/25/18, 11:51 PM

## 2018-07-25 NOTE — Progress Notes (Addendum)
Patient ambulated to bathroom to empty bladder, patient was unable to void and stated she hadnt had much fluid intake. When this nurse and patient attempted to walk back to the bed, she stated she felt SOB and was having a panic attack and stedy was used to return patient to bed. VS were taken and WNL.Pt was also tearful and rated pain at an 8/10, Emotional support offered. Patient stated she felt better after she returned to the bed. Patient stated she felt the same way  post delivery as well.  Will continue to monitor.

## 2018-07-26 ENCOUNTER — Other Ambulatory Visit (HOSPITAL_COMMUNITY)
Admission: RE | Admit: 2018-07-26 | Discharge: 2018-07-26 | Disposition: A | Discharge status: Home / Self Care | Payer: Medicaid Other | Source: Ambulatory Visit

## 2018-07-26 ENCOUNTER — Encounter: Payer: Medicaid Other | Admitting: Obstetrics & Gynecology

## 2018-07-26 DIAGNOSIS — O480 Post-term pregnancy: Secondary | ICD-10-CM

## 2018-07-26 DIAGNOSIS — Z3A41 41 weeks gestation of pregnancy: Secondary | ICD-10-CM

## 2018-07-26 MED ORDER — IBUPROFEN 600 MG PO TABS: 600.0000 mg | ORAL_TABLET | Freq: Four times a day (QID) | ORAL | 0 refills | Status: DC | PRN

## 2018-07-26 NOTE — Lactation Note (Signed)
This note was copied from a baby's chart. Lactation Consultation Note  Patient Name: Madison Guzman [id number] Date: 07/26/2018 Reason for consult: Follow-up assessment;Term;Infant weight loss;Other (Comment)(post dates - elevated bili check - per mom baby is going to have a blood test)  LC reviewed doc flow sheets with mom .  Baby is 92 hours old  Presently grandmother holding baby and he is asleep. Per mom  Last fed at 8 am for 25 mins with swallows and the latch was comfortable.  Per mom breast feeding is going well.  LC reviewed sore nipple and engorgement prevention and tx.  Per mom has 2 pumps at home. Storage of breast milk reviewed.  LC stressed importance of STS Feedings until the baby is back to birth weight and gaining  Steadily.  Since the baby is not showing feeding cues at present / [place] mom to call for latch assessment.   Mom aware of the [place] resources if needed.     Maternal Data Has patient been taught Hand Expression?: Yes(per mom feels comfortable)  Feeding Feeding Type: (per mom baby last fed at 8 am - asleep at present)  [facility] Score                   Interventions Interventions: Breast feeding basics reviewed  Lactation Tools Discussed/Used Pump Review: Milk Storage   Consult Status Consult Status: Complete Date: 07/26/18    Jerlyn Ly Naydene Kamrowski 07/26/2018, 10:10 AM

## 2018-07-26 NOTE — Progress Notes (Signed)
CSW received consult for history of anxiety and depression.  CSW met with MOB to offer support and complete assessment.    MOB sitting up in bed holding infant, when CSW entered the room. CSW introduced self and explained reason for consult to which MOB expressed understanding. MOB pleasant and easy to engage and was very attentive and appropriate with infant throughout CSW visit. CSW inquired about MOB's mental health history and MOB acknowledged having a history of anxiety dating back to high school. MOB reported, at that time, her mother had her in therapy but denied any current counseling. MOB shared that she [facility] her 3-year-old daughter was born that has caused her some added anxiety. Per MOB, she is protective of her daughter and experiences excessive worrying about things that could happen. CSW inquired about MOB's coping skills and MOB stated she tries not to think about it. MOB stated she is not interested in being started on any medications and reported that therapy costs "a lot of money". CSW offered to make [facility] referral for additional in-home support to which MOB was receptive. CSW also provided MOB with a list of counseling resources. CSW provided education regarding the baby blues period vs. perinatal mood disorders, discussed treatment and gave resources for mental health follow up if concerns arise.  CSW recommends self-evaluation during the postpartum time period using the New Mom Checklist from Postpartum Progress and encouraged MOB to contact a medical professional if symptoms are noted at any time. MOB denied any current SI, HI or DV and reported having good support from her parents and best-friend.  MOB reported having all essential items for infant once discharged. MOB stated infant would be sleeping in a basinet once home. CSW provided review of Sudden Infant Death Syndrome (SIDS) precautions and safe sleeping habits.    CSW  identifies no further need for intervention and no barriers to discharge at this time.  Mackenzie Burcham, LCSWA  [facility] [phone]  

## 2018-07-26 NOTE — Progress Notes (Signed)
Urine output of 50 ml

## 2018-07-26 NOTE — Discharge Summary (Signed)
Postpartum Discharge Summary     Patient Name: Madison Guzman DOB: [birth year] MRN: [id number]  Date of admission: 07/25/2018 Delivering Provider: CONSTANT, PEGGY   Date of discharge: 07/26/2018  Admitting diagnosis: terms, contractions Intrauterine pregnancy: [redacted]w[redacted]d     Secondary diagnosis:  Active Problems:   Depression   Asthma, moderate persistent, well-controlled   Migraine without aura and without status migrainosus, not intractable   Chronic daily headache   Obesity in pregnancy   Indication for care in labor or delivery   Encounter for induction of labor   SVD (spontaneous vaginal delivery)  Additional problems: none     Discharge diagnosis: Term Pregnancy Delivered and depression                                                                                                 Post partum procedures:none  Augmentation: none  Complications: None  Hospital course:  Onset of Labor With Vaginal Delivery     23 y.o. yo [id number] at [redacted]w[redacted]d was admitted in Active Labor on 07/25/2018. Patient had an uncomplicated labor course as follows:  Membrane Rupture Time/Date: 4:48 AM ,07/25/2018   Intrapartum Procedures: Episiotomy: None [1]                                         Lacerations:  2nd degree [3]  Patient had a delivery of a Viable infant. 07/25/2018  Information for the patient's newborn:  Sophiea, Ueda [[id number]]  Delivery Method: Vaginal, Spontaneous(Filed from Delivery Summary)     Pateint had an uncomplicated postpartum course.  She is ambulating, tolerating a regular diet, passing flatus, and urinating well. Patient is discharged home in stable condition on 07/26/18.   Magnesium Sulfate recieved: No BMZ received: No  Physical exam  Vitals:   07/25/18 1730 07/25/18 2151 07/26/18 0650 07/26/18 0734  BP: 139/84 110/67  117/72  Pulse: 89 89 68   Resp: 18 18 18    Temp: 98.6 F (37 C) 98.4 F (36.9 C) 98 F (36.7 C)   TempSrc: Oral Oral    SpO2:  100% 100%    Weight:      Height:       General: alert, cooperative and no distress Lochia: appropriate Uterine Fundus: firm Incision: N/A DVT Evaluation: No evidence of DVT seen on physical exam. No significant calf/ankle edema. Labs: Lab Results  Component Value Date   WBC 14.5 (H) 07/25/2018   HGB 12.0 07/25/2018   HCT 36.5 07/25/2018   MCV 82.4 07/25/2018   PLT 276 07/25/2018   CMP Latest Ref Rng & Units 11/04/2016  Glucose 65 - 99 mg/dL 86  BUN 6 - 20 mg/dL 7  Creatinine 0.44 - 1.00 mg/dL 0.59  Sodium 135 - 145 mmol/L 135  Potassium 3.5 - 5.1 mmol/L 4.0  Chloride 101 - 111 mmol/L 105  CO2 22 - 32 mmol/L 18(L)  Calcium 8.9 - 10.3 mg/dL 9.1  Total Protein 6.5 - 8.1 g/dL 6.4(L)  Total Bilirubin 0.3 - 1.2  mg/dL 0.7  Alkaline Phos 38 - 126 U/L 165(H)  AST 15 - 41 U/L 17  ALT 14 - 54 U/L 9(L)    Discharge instruction: per After Visit Summary and "Baby and Me Booklet".  After visit meds:  Allergies as of 07/26/2018   No Known Allergies     Medication List    STOP taking these medications   Accu-Chek FastClix Lancets Misc   glucose blood test strip Commonly known as: Accu-Chek Guide     TAKE these medications   cyclobenzaprine 10 MG tablet Commonly known as: FLEXERIL Take 1 tablet (10 mg total) by mouth every 8 (eight) hours as needed for muscle spasms.   ibuprofen 600 MG tablet Commonly known as: ADVIL Take 1 tablet (600 mg total) by mouth every 6 (six) hours as needed for moderate pain or cramping.       Diet: routine diet  Activity: Advance as tolerated. Pelvic rest for 6 weeks.   Outpatient follow up:4 weeks Follow up Appt: Future Appointments  Date Time Provider Department Center  08/26/2018  1:15 PM Allie Bossierove, Myra C, MD [facility]   Follow up Visit: Follow-up Information    [facility] [place]. Schedule an appointment as soon as possible for a visit in 4 week(s).   Specialty: Obstetrics and Gynecology Why: Make  appointment to be seen in 4 weeks for postpartum care  Contact information: [address], [address] [place] [id number] [phone]          Newborn Data: Live born female  Birth Weight: 7 lb 12.9 oz (3541 g) APGAR: 6, 8  Newborn Delivery   Birth date/time: 07/25/2018 05:51:00 Delivery type: Vaginal, Spontaneous      Baby Feeding: Breast Disposition:home with mother   07/26/2018 Sharyon CableVeronica C Yahia Bottger, CNM

## 2018-07-28 ENCOUNTER — Inpatient Hospital Stay (HOSPITAL_COMMUNITY): Admission: AD | Admit: 2018-07-28 | Payer: Medicaid Other | Source: Home / Self Care

## 2018-07-28 ENCOUNTER — Inpatient Hospital Stay (HOSPITAL_COMMUNITY): Payer: Medicaid Other

## 2018-07-28 LAB — RPR: RPR Ser Ql: NONREACTIVE

## 2018-08-19 ENCOUNTER — Telehealth: Payer: Self-pay

## 2018-08-19 DIAGNOSIS — Z9189 Other specified personal risk factors, not elsewhere classified: Secondary | ICD-10-CM

## 2018-08-19 MED ORDER — IBUPROFEN 600 MG PO TABS: 600.0000 mg | ORAL_TABLET | Freq: Four times a day (QID) | ORAL | 0 refills | Status: DC | PRN

## 2018-08-19 NOTE — Telephone Encounter (Signed)
Refill of Ibuprofen sent to pharmacy per Dr.Dove.

## 2018-08-25 ENCOUNTER — Telehealth: Payer: Self-pay | Admitting: *Deleted

## 2018-08-25 NOTE — Telephone Encounter (Signed)
Called patient to ask screening questions prior to her appointment on 08/26/18 at 1:15pm but there was no answer at home number and could not leave a message. Mobile number had another name on the voicemail so I did not leave a message.

## 2018-08-26 ENCOUNTER — Ambulatory Visit (INDEPENDENT_AMBULATORY_CARE_PROVIDER_SITE_OTHER): Payer: Medicaid Other | Admitting: Obstetrics & Gynecology

## 2018-08-26 ENCOUNTER — Encounter: Payer: Self-pay | Admitting: Obstetrics & Gynecology

## 2018-08-26 ENCOUNTER — Other Ambulatory Visit: Payer: Self-pay

## 2018-08-26 DIAGNOSIS — Z1389 Encounter for screening for other disorder: Secondary | ICD-10-CM | POA: Diagnosis not present

## 2018-08-26 DIAGNOSIS — Z6838 Body mass index (BMI) 38.0-38.9, adult: Secondary | ICD-10-CM | POA: Diagnosis not present

## 2018-08-26 DIAGNOSIS — E661 Drug-induced obesity: Secondary | ICD-10-CM

## 2018-08-26 NOTE — Progress Notes (Signed)
[place] Partum Exam  Madison Guzman is a 23 y.o. G82P2002 female who presents for a postpartum visit. She is 4 weeks postpartum following a spontaneous vaginal delivery with 2nd degree perineal tar. I have fully reviewed the prenatal and intrapartum course. The delivery was at 89 w2d gestational weeks.  Anesthesia: local. Postpartum course has been unremarkable. Baby's course has been unremarkable. Baby is feeding by breast. Bleeding brown. Bowel function is abnormal with constipation Bladder function is normal. Patient is not sexually active. Contraception method is unsure. Postpartum depression screening:neg  The following portions of the patient's history were reviewed and updated as appropriate: allergies, current medications, past family history, past medical history, past social history, past surgical history and problem list.   Review of Systems Pertinent items are noted in HPI.    Objective:  unknown if currently breastfeeding.  General:  alert   Breasts:  inspection negative, no nipple discharge or bleeding, no masses or nodularity palpable  Lungs: clear to auscultation bilaterally  Heart:  regular rate and rhythm, S1, S2 normal, no murmur, click, rub or gallop  Abdomen: soft, non-tender; bowel sounds normal; no masses,  no organomegaly   Vulva:  normal, healing well  Vagina: normal vagina  Cervix:  anteverted  Corpus: not examined  Adnexa:  not evaluated  Rectal Exam: Not performed.        Assessment:    Normal postpartum exam. Pap smear not done at today's visit.   Plan:   1. Contraception: none 2. I attempted to do a pap smear but she found it too uncomfortable so she will come back in about 4 weeks for this 3. Check hba1c since she declined a glucola during pregnancy

## 2018-08-27 LAB — HEMOGLOBIN A1C
Hgb A1c MFr Bld: 5.4 % of total Hgb (ref <5.7)
Mean Plasma Glucose: 108 (calc)
eAG (mmol/L): 6 (calc)

## 2018-09-24 ENCOUNTER — Ambulatory Visit: Payer: Medicaid Other | Admitting: Certified Nurse Midwife

## 2019-02-26 ENCOUNTER — Other Ambulatory Visit: Payer: Self-pay

## 2019-02-26 ENCOUNTER — Emergency Department (INDEPENDENT_AMBULATORY_CARE_PROVIDER_SITE_OTHER)
Admission: EM | Admit: 2019-02-26 | Discharge: 2019-02-26 | Disposition: A | Discharge status: Home / Self Care | Payer: Medicaid Other | Source: Home / Self Care | Attending: Family Medicine | Admitting: Family Medicine

## 2019-02-26 DIAGNOSIS — K0889 Other specified disorders of teeth and supporting structures: Secondary | ICD-10-CM

## 2019-02-26 MED ORDER — AMOXICILLIN 875 MG PO TABS: 875.0000 mg | ORAL_TABLET | Freq: Two times a day (BID) | ORAL | 0 refills | Status: DC

## 2019-02-26 MED ORDER — ACETAMINOPHEN 325 MG PO TABS
650.0000 mg | ORAL_TABLET | Freq: Once | ORAL | Status: AC
  Administered 2019-02-26: 14:00:00 650 mg via ORAL

## 2019-02-26 NOTE — Discharge Instructions (Addendum)
May take Ibuprofen 200mg, 4 tabs every 8 hours with food.   If symptoms become significantly worse during the night or over the weekend, proceed to the local emergency room.  

## 2019-02-26 NOTE — ED Triage Notes (Addendum)
Pt c/o tooth pain in top right side of mouth. Taking tylenol and ibuprofen around the clock. Last dose was ibuprofen at 8am. Pain 10/10

## 2019-02-26 NOTE — ED Provider Notes (Signed)
[facility]    CSN: [id number] Arrival date & time: 02/26/19  1250      History   Chief Complaint Chief Complaint  Patient presents with  . Dental Pain    HPI Madison Guzman is a 24 y.o. female.   Patient complains of pain in a right upper molar that developed 5 days ago.  The pain became worse 3 days ago, not responding to ibuprofen 800mg  and Flexeril.  The history is provided by the patient.  Dental Pain Location:  Upper Upper teeth location:  2/RU 2nd molar Quality:  Aching and constant Severity:  Moderate Onset quality:  Sudden Duration:  5 days Timing:  Constant Progression:  Worsening Chronicity:  New Relieved by:  Nothing Worsened by:  Pressure and touching Ineffective treatments:  NSAIDs Associated symptoms: gum swelling   Associated symptoms: no congestion, no difficulty swallowing, no drooling, no facial pain, no facial swelling, no fever, no headaches, no neck pain, no neck swelling, no oral bleeding, no oral lesions and no trismus   Risk factors: lack of dental care     Past Medical History:  Diagnosis Date  . Anxiety   . Asthma   . Depression   . Migraine    Pt reports since age of 44  . Pharyngitis 01/08/2013   [facility] - strep neg    Patient Active Problem List   Diagnosis Date Noted  . SVD (spontaneous vaginal delivery) 07/26/2018  . Indication for care in labor or delivery 07/25/2018  . Encounter for induction of labor 07/25/2018  . Back pain in pregnancy 07/07/2018  . Braxton Hicks contractions 07/07/2018  . Obesity in pregnancy 06/03/2016  . Supervision of normal pregnancy 03/11/2016  . Pseudotumor cerebri syndrome 09/22/2014  . Chronic daily headache 06/16/2013  . Migraine without aura and without status migrainosus, not intractable 12/01/2012  . Anxiety state, unspecified 12/01/2012  . Insomnia 12/01/2012  . Depression 09/16/2012  . Asthma, moderate persistent, well-controlled 09/16/2012    Past Surgical  History:  Procedure Laterality Date  . CHOLECYSTECTOMY    . TYMPANOSTOMY TUBE PLACEMENT Bilateral     OB History    Gravida  2   Para  2   Term  2   Preterm  0   AB  0   Living  2     SAB  0   TAB  0   Ectopic  0   Multiple  0   Live Births  2            Home Medications    Prior to Admission medications   Medication Sig Start Date End Date Taking? Authorizing Provider  acetaminophen (TYLENOL) 325 MG tablet Take 650 mg by mouth every 6 (six) hours as needed.    [provider]  amoxicillin (AMOXIL) 875 MG tablet Take 1 tablet (875 mg total) by mouth 2 (two) times daily. 02/26/19   02/28/19, MD  cyclobenzaprine (FLEXERIL) 10 MG tablet Take 1 tablet (10 mg total) by mouth every 8 (eight) hours as needed for muscle spasms. Patient not taking: Reported on 08/26/2018 07/07/18   Rasch, 07/09/18 I, NP  ibuprofen (ADVIL) 600 MG tablet Take 1 tablet (600 mg total) by mouth every 6 (six) hours as needed for moderate pain or cramping. 08/19/18   10/20/18, MD    Family History Family History  Problem Relation Age of Onset  . Asthma Mother   . Hypertension Mother   . Diabetes  Maternal Grandmother   . Hyperlipidemia Maternal Grandmother   . Hypertension Maternal Grandmother   . Mental illness Maternal Grandmother   . Migraines Maternal Grandmother   . Seizures Father   . ADD / ADHD Brother        1 Younger brother has ADHD  . Mental illness Maternal Aunt        Bipolar  . ADD / ADHD Maternal Uncle     Social History Social History   Tobacco Use  . Smoking status: Passive Smoke Exposure - Never Smoker  . Smokeless tobacco: Never Used  Substance Use Topics  . Alcohol use: No    Alcohol/week: 0.0 standard drinks  . Drug use: No     Allergies   Patient has no known allergies.   Review of Systems Review of Systems  Constitutional: Negative for fever.  HENT: Negative for congestion, drooling, facial swelling and mouth sores.     Musculoskeletal: Negative for neck pain.  Neurological: Negative for headaches.  All other systems reviewed and are negative.    Physical Exam Triage Vital Signs ED Triage Vitals  Enc Vitals Group     BP 02/26/19 1329 (!) 150/89     Pulse Rate 02/26/19 1329 78     Resp 02/26/19 1329 18     Temp 02/26/19 1329 98.3 F (36.8 C)     Temp Source 02/26/19 1329 Oral     SpO2 02/26/19 1329 100 %     Weight 02/26/19 1330 240 lb 4.8 oz (109 kg)     Height 02/26/19 1330 5\' 3"  (1.6 m)     Head Circumference --      Peak Flow --      Pain Score 02/26/19 1330 10     Pain Loc --      Pain Edu? --      Excl. in [place]? --    No data found.  Updated Vital Signs BP (!) 150/89 (BP Location: Right Arm)   Pulse 78   Temp 98.3 F (36.8 C) (Oral)   Resp 18   Ht 5\' 3"  (1.6 m)   Wt 109 kg   LMP  (LMP Unknown)   SpO2 100%   BMI 42.57 kg/m   Visual Acuity Right Eye Distance:   Left Eye Distance:   Bilateral Distance:    Right Eye Near:   Left Eye Near:    Bilateral Near:     Physical Exam Vitals and nursing note reviewed.  Constitutional:      General: She is not in acute distress. HENT:     Head: Normocephalic.     Right Ear: Tympanic membrane, ear canal and external ear normal.     Left Ear: Tympanic membrane, ear canal and external ear normal.     Nose: Nose normal.     Mouth/Throat:     Mouth: Mucous membranes are moist.      Comments: Tooth #2 has tenderness to tap, with gingival tenderness also.  No fluctuance. Eyes:     Conjunctiva/sclera: Conjunctivae normal.     Pupils: Pupils are equal, round, and reactive to light.  Cardiovascular:     Rate and Rhythm: Normal rate.  Pulmonary:     Effort: Pulmonary effort is normal.  Lymphadenopathy:     Cervical: No cervical adenopathy.  Skin:    General: Skin is warm and dry.  Neurological:     Mental Status: She is alert.      UC Treatments / Results  Labs (  all labs ordered are listed, but only abnormal results are  displayed) Labs Reviewed - No data to display  EKG   Radiology No results found.  Procedures Procedures (including critical care time)  Medications Ordered in UC Medications  acetaminophen (TYLENOL) tablet 650 mg (650 mg Oral Given 02/26/19 1332)    Initial Impression / Assessment and Plan / UC Course  I have reviewed the triage vital signs and the nursing notes.  Pertinent labs & imaging results that were available during my care of the patient were reviewed by me and considered in my medical decision making (see chart for details).    Begin amoxicillin. Followup with dentist as soon as possible.    Final Clinical Impressions(s) / UC Diagnoses   Final diagnoses:  Pain, dental     Discharge Instructions     May take Ibuprofen 200mg , 4 tabs every 8 hours with food.   If symptoms become significantly worse during the night or over the weekend, proceed to the local emergency room.    ED Prescriptions    Medication Sig Dispense Auth. Provider   amoxicillin (AMOXIL) 875 MG tablet Take 1 tablet (875 mg total) by mouth 2 (two) times daily. 20 tablet , MD        Lattie Haw, MD 03/03/19 [phone]

## 2019-03-20 IMAGING — US US MFM FETAL BPP W/O NON-STRESS
1 series · 14 of 28 positions shown · non-contrast
Comparison: none

[Series 1: us mfm fetal bpp w/o non-stress · 53 acquisitions, 14 frames shown]
[im 2/53]
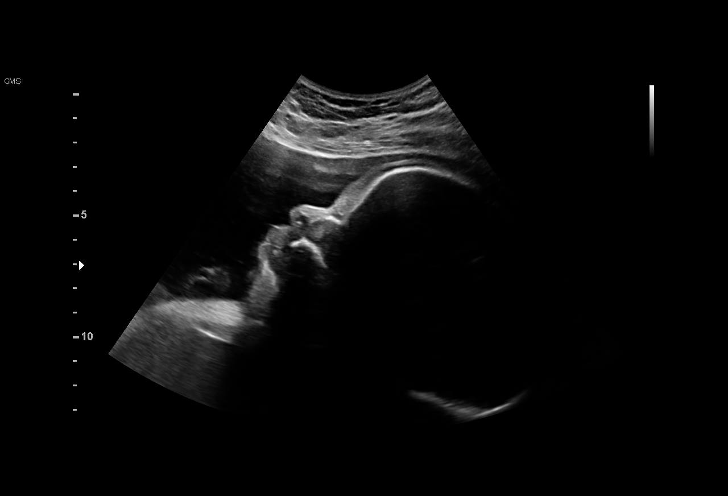
[im 6/53]
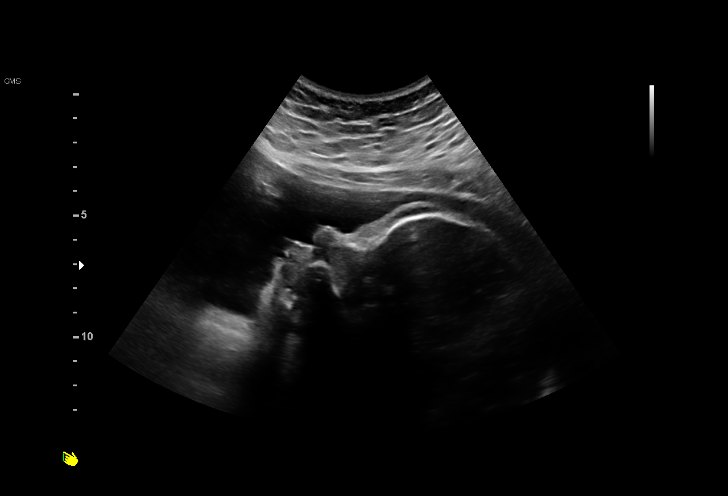
[im 10/53]
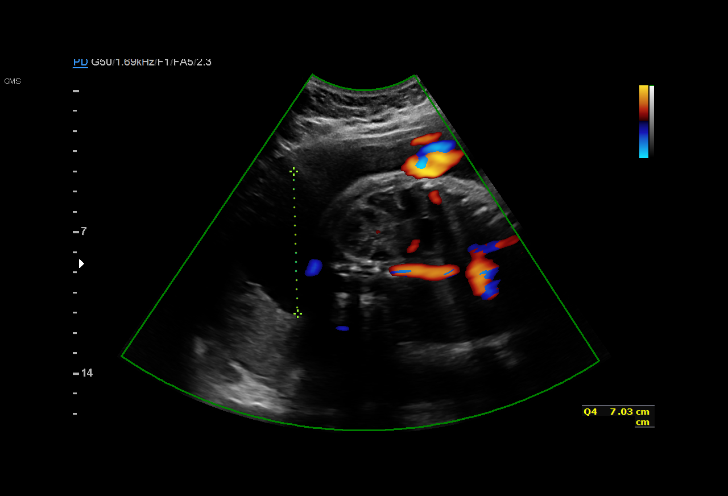
[im 14/53]
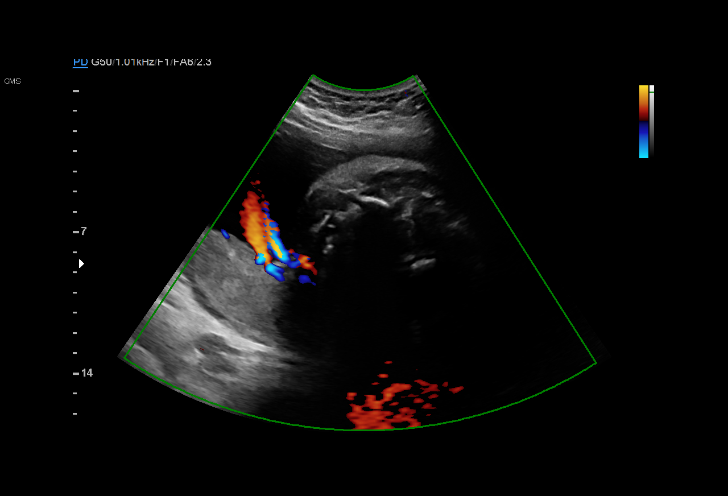
[im 18/53]
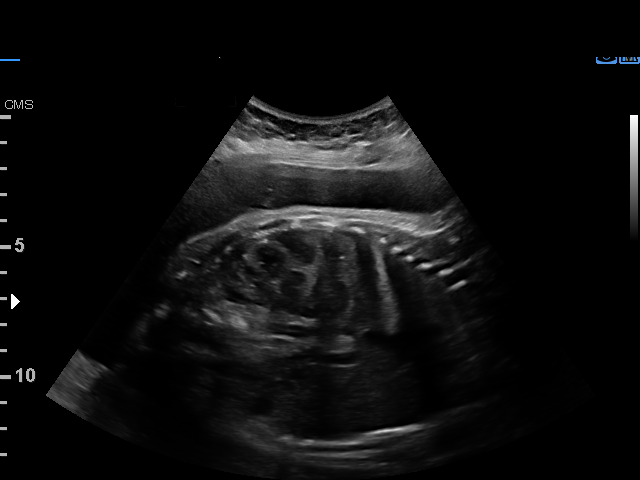
[im 22/53]
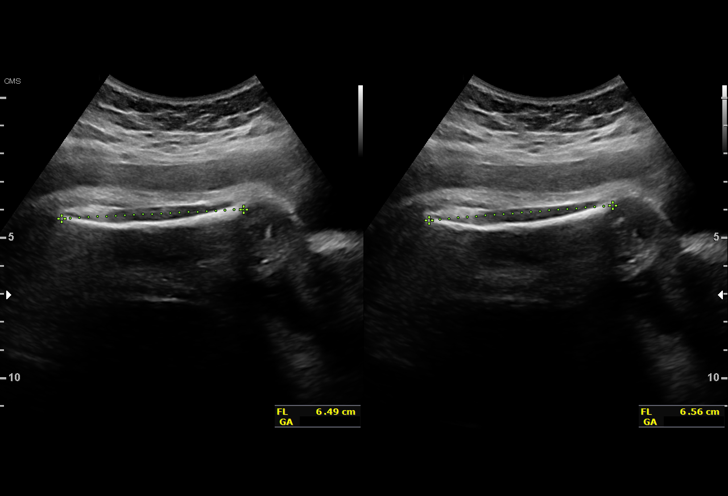
[im 26/53]
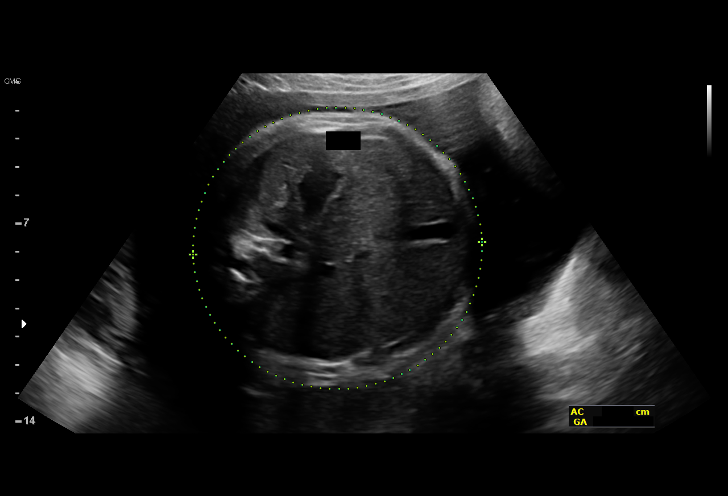
[im 29/53]
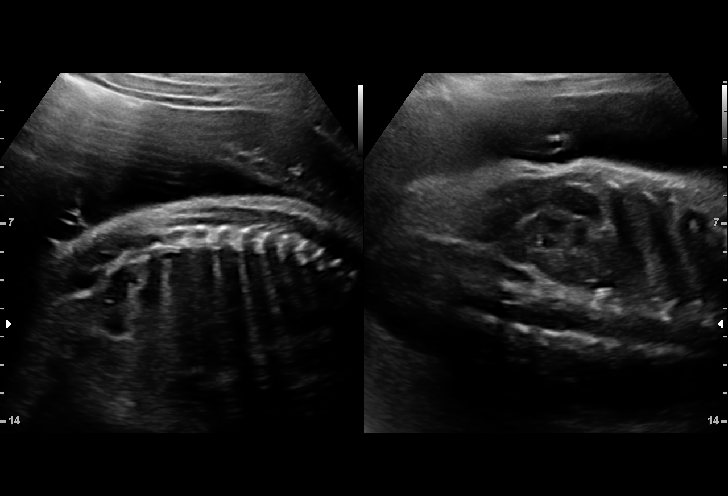
[im 33/53]
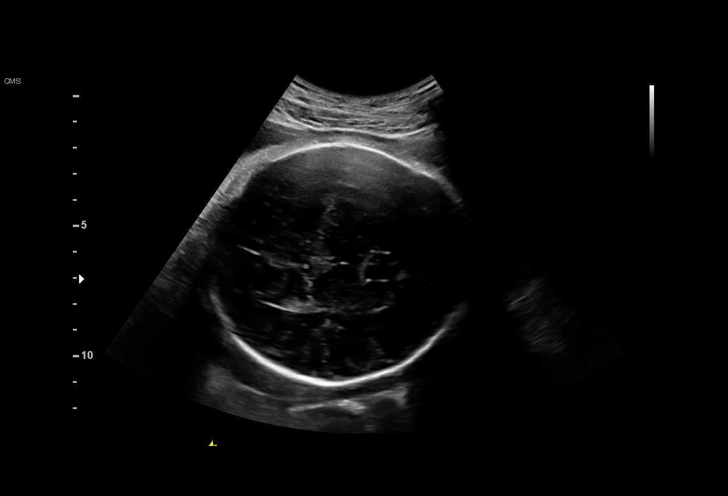
[im 37/53]
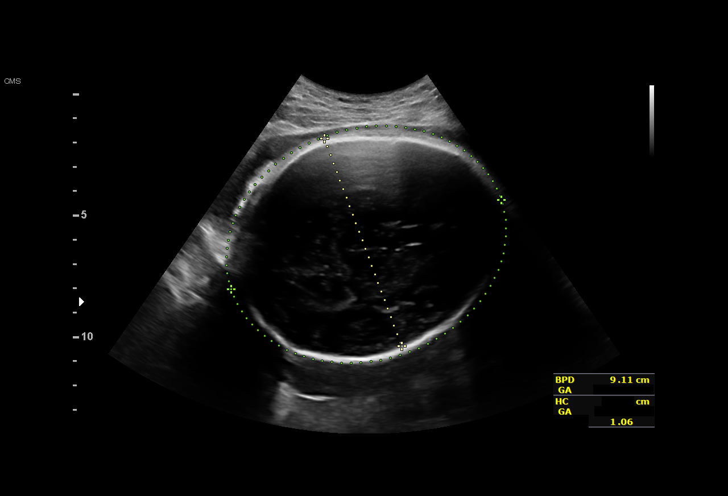
[im 41/53]
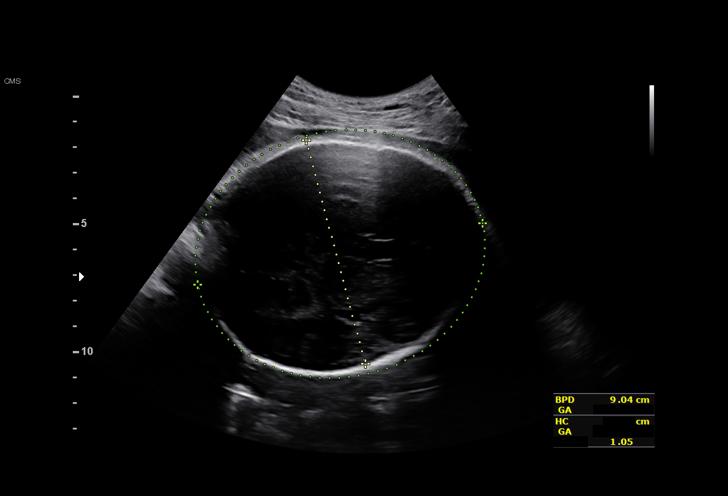
[im 45/53]
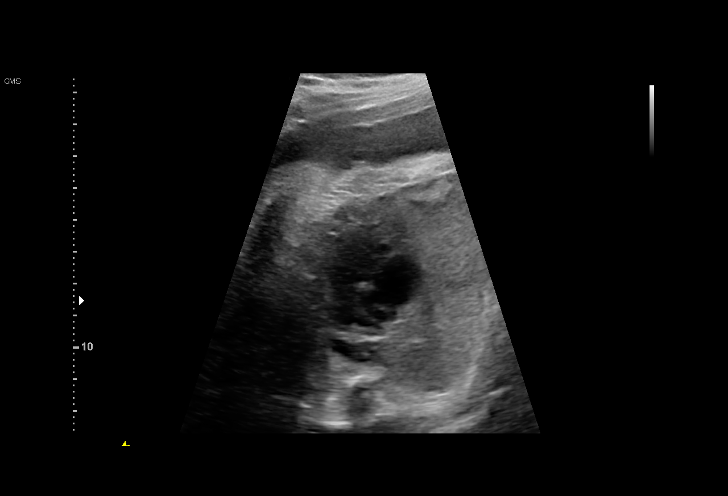
[im 49/53]
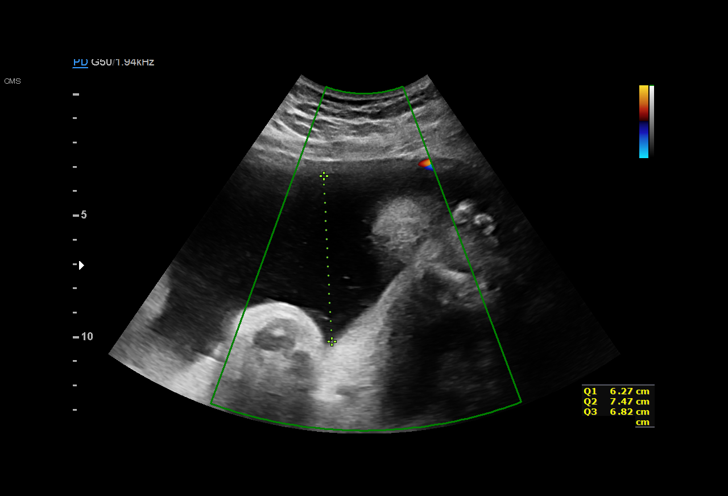
[im 53/53]
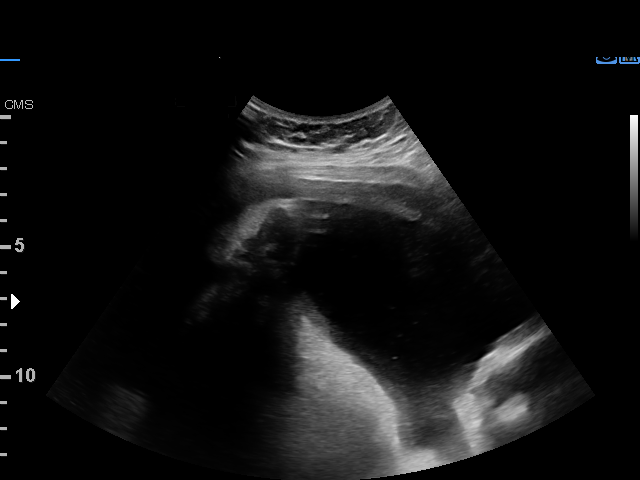

[14 of 28 positions shown; findings below may reference images not displayed]

1  BEATRIZ EMERSON            [id number]      [id number]     [id number]
2  RIFUMO MEREKO            [id number]      [id number]     [id number]
Indications

35 weeks gestation of pregnancy
Encounter for other antenatal screening
follow-up
Obesity complicating pregnancy, third
trimester
Medical complication of pregnancy
(pseudotumor cerebri)
OB History

Blood Type:            Height:  5'4"   Weight (lb):  225       BMI:
Gravidity:    1
Fetal Evaluation

Num Of Fetuses:     1
Fetal Heart         153
Rate(bpm):
Cardiac Activity:   Observed
Presentation:       Cephalic
Placenta:           Posterior, above cervical os
P. Cord Insertion:  Previously Visualized

Amniotic Fluid
AFI FV:      Polyhydramnios

AFI Sum(cm)     %Tile       Largest Pocket(cm)
32.18           > 97

RUQ(cm)       RLQ(cm)       LUQ(cm)        LLQ(cm)
8.19
Biophysical Evaluation

Amniotic F.V:   Polyhydramnios             F. Tone:         Observed
F. Movement:    Observed                   Score:           [DATE]
F. Breathing:   Observed
Biometry

BPD:      90.8  mm     G. Age:  36w 6d         88  %    CI:         74.78  %    70 - 86
FL/HC:       19.6  %    20.1 -
HC:      333.2  mm     G. Age:  38w 0d         80  %    HC/AC:       1.05       0.93 -
AC:      316.4  mm     G. Age:  35w 4d         62  %    FL/BPD:      71.8  %    71 - 87
FL:       65.2  mm     G. Age:  33w 4d          8  %    FL/AC:       20.6  %    20 - 24
HUM:      58.4  mm     G. Age:  33w 6d         35  %

Est. FW:    8890   gm    5 lb 14 oz     63  %
Gestational Age

LMP:           40w 6d        Date:  12/13/15                 EDD:    09/18/16
U/S Today:     36w 0d                                        EDD:    10/22/16
Best:          35w 3d     Det. By:  Early Ultrasound         EDD:    10/26/16
(03/11/16)
Anatomy

Cranium:               Appears normal         Aortic Arch:            Previously seen
Cavum:                 Appears normal         Ductal Arch:            Previously seen
Ventricles:            Appears normal         Diaphragm:              Previously seen
Choroid Plexus:        Previously seen        Stomach:                Appears normal, left
sided
Cerebellum:            Appears normal         Abdomen:                Appears normal
Posterior Fossa:       Previously seen        Abdominal Wall:         Previously seen
Nuchal Fold:           Previously seen        Cord Vessels:           Appears normal (3
vessel cord)
Face:                  Appears normal         Kidneys:                Appear normal
(orbits and profile)
Lips:                  Appears normal         Bladder:                Appears normal
Thoracic:              Appears normal         Spine:                  Previously seen
Heart:                 Appears normal         Upper Extremities:      Previously seen
(4CH, axis, and situs
RVOT:                  Appears normal         Lower Extremities:      Previously seen
LVOT:                  Appears normal

Other:  Fetus appears to be a female. Heels and 5th digit previously
visualized. Technically difficult due to maternal habitus and fetal
position.
Cervix Uterus Adnexa

Cervix
Not visualized (advanced GA >09wks)

Uterus
No abnormality visualized.

Left Ovary
Not visualized.
Right Ovary
Within normal limits.

Cul De Sac:   No free fluid seen.

Adnexa:       No abnormality visualized.
Impression

SIUP at 35+3 weeks
Normal interval anatomy; anatomic survey complete
Mild polyhydramnios
Appropriate interval growth with EFW at the 63rd %tile
BPP [DATE]
Recommendations

Begin weekly BPPs
Suggest a 39 week delivery if the polyhydramnios persists

## 2019-03-31 ENCOUNTER — Encounter: Payer: Self-pay | Admitting: Family Medicine

## 2019-03-31 ENCOUNTER — Emergency Department
Admission: EM | Admit: 2019-03-31 | Discharge: 2019-03-31 | Disposition: A | Discharge status: Home / Self Care | Payer: Self-pay | Source: Home / Self Care

## 2019-03-31 ENCOUNTER — Other Ambulatory Visit: Payer: Self-pay

## 2019-03-31 ENCOUNTER — Emergency Department
Admission: EM | Admit: 2019-03-31 | Discharge: 2019-03-31 | Disposition: A | Discharge status: Home / Self Care | Payer: Medicaid Other | Source: Home / Self Care

## 2019-03-31 DIAGNOSIS — K029 Dental caries, unspecified: Secondary | ICD-10-CM

## 2019-03-31 MED ORDER — AMOXICILLIN 875 MG PO TABS: 875.0000 mg | ORAL_TABLET | Freq: Two times a day (BID) | ORAL | 1 refills | Status: DC

## 2019-03-31 MED ORDER — HYDROCODONE-ACETAMINOPHEN 5-325 MG PO TABS: 1.0000 | ORAL_TABLET | Freq: Four times a day (QID) | ORAL | 0 refills | Status: DC | PRN

## 2019-03-31 NOTE — ED Triage Notes (Signed)
R jaw pain x 2 days radiates up to ear /head- broken tooth on upper side -molar

## 2019-03-31 NOTE — ED Provider Notes (Signed)
[facility]    CSN: [id number] Arrival date & time: 03/31/19  1258      History   Chief Complaint Chief Complaint  Patient presents with  . Jaw Pain    HPI Madison Guzman is a 24 y.o. female.   24 yo established [facility] patient with dental pain, seen here 3 weeks ago and treated with amoxicillin.  She is breast feeding but in quite a bit of pain.  Amoxicillin worked last time but she had trouble getting to see a dentist  R jaw pain x 2 days radiates up to ear /head- broken tooth on upper side -molar     Past Medical History:  Diagnosis Date  . Anxiety   . Asthma   . Depression   . Migraine    Pt reports since age of 66  . Pharyngitis 01/08/2013   [facility] - strep neg    Patient Active Problem List   Diagnosis Date Noted  . SVD (spontaneous vaginal delivery) 07/26/2018  . Indication for care in labor or delivery 07/25/2018  . Encounter for induction of labor 07/25/2018  . Back pain in pregnancy 07/07/2018  . Braxton Hicks contractions 07/07/2018  . Obesity in pregnancy 06/03/2016  . Supervision of normal pregnancy 03/11/2016  . Pseudotumor cerebri syndrome 09/22/2014  . Chronic daily headache 06/16/2013  . Migraine without aura and without status migrainosus, not intractable 12/01/2012  . Anxiety state, unspecified 12/01/2012  . Insomnia 12/01/2012  . Depression 09/16/2012  . Asthma, moderate persistent, well-controlled 09/16/2012    Past Surgical History:  Procedure Laterality Date  . CHOLECYSTECTOMY    . TYMPANOSTOMY TUBE PLACEMENT Bilateral     OB History    Gravida  2   Para  2   Term  2   Preterm  0   AB  0   Living  2     SAB  0   TAB  0   Ectopic  0   Multiple  0   Live Births  2            Home Medications    Prior to Admission medications   Medication Sig Start Date End Date Taking? Authorizing Provider  acetaminophen (TYLENOL) 325 MG tablet Take 650 mg by mouth every 6 (six) hours as needed.     [provider]  amoxicillin (AMOXIL) 875 MG tablet Take 1 tablet (875 mg total) by mouth 2 (two) times daily. 03/31/19   Elvina Sidle, MD  HYDROcodone-acetaminophen (NORCO) 5-325 MG tablet Take 1 tablet by mouth every 6 (six) hours as needed for moderate pain. 03/31/19   Elvina Sidle, MD  ibuprofen (ADVIL) 600 MG tablet Take 1 tablet (600 mg total) by mouth every 6 (six) hours as needed for moderate pain or cramping. 08/19/18   Allie Bossier, MD    Family History Family History  Problem Relation Age of Onset  . Asthma Mother   . Hypertension Mother   . Diabetes Maternal Grandmother   . Hyperlipidemia Maternal Grandmother   . Hypertension Maternal Grandmother   . Mental illness Maternal Grandmother   . Migraines Maternal Grandmother   . Seizures Father   . ADD / ADHD Brother        1 Younger brother has ADHD  . Mental illness Maternal Aunt        Bipolar  . ADD / ADHD Maternal Uncle     Social History Social History   Tobacco Use  . Smoking  status: Passive Smoke Exposure - Never Smoker  . Smokeless tobacco: Never Used  Substance Use Topics  . Alcohol use: No    Alcohol/week: 0.0 standard drinks  . Drug use: No     Allergies   Patient has no known allergies.   Review of Systems Review of Systems  HENT: Positive for dental problem.   All other systems reviewed and are negative.    Physical Exam Triage Vital Signs ED Triage Vitals [03/31/19 1313]  Enc Vitals Group     BP 137/87     Pulse Rate 96     Resp      Temp 98.5 F (36.9 C)     Temp Source Oral     SpO2 98 %     Weight      Height      Head Circumference      Peak Flow      Pain Score      Pain Loc      Pain Edu?      Excl. in [place]?    No data found.  Updated Vital Signs BP 137/87 (BP Location: Right Arm)   Pulse 96   Temp 98.5 F (36.9 C) (Oral)   Ht 5\' 4"  (1.626 m)   Wt 125.6 kg   SpO2 98%   BMI 47.55 kg/m    Physical Exam Vitals and nursing note reviewed.    Constitutional:      Appearance: Normal appearance. She is obese.  HENT:     Mouth/Throat:     Mouth: Mucous membranes are moist.     Comments: Dental carie tooth #30 Eyes:     Conjunctiva/sclera: Conjunctivae normal.  Cardiovascular:     Rate and Rhythm: Normal rate.  Pulmonary:     Effort: Pulmonary effort is normal.  Musculoskeletal:        General: Normal range of motion.     Cervical back: Normal range of motion and neck supple.  Skin:    General: Skin is warm and dry.  Neurological:     General: No focal deficit present.     Mental Status: She is alert.  Psychiatric:        Mood and Affect: Mood normal.      UC Treatments / Results  Labs (all labs ordered are listed, but only abnormal results are displayed) Labs Reviewed - No data to display  EKG   Radiology No results found.  Procedures Procedures (including critical care time)  Medications Ordered in UC Medications - No data to display  Initial Impression / Assessment and Plan / UC Course  I have reviewed the triage vital signs and the nursing notes.  Pertinent labs & imaging results that were available during my care of the patient were reviewed by me and considered in my medical decision making (see chart for details).    Final Clinical Impressions(s) / UC Diagnoses   Final diagnoses:  Pain due to dental caries     Discharge Instructions     Try to get in with the dentist soon.  Use ibuprofen during day and hydrocodone at night.    ED Prescriptions    Medication Sig Dispense Auth. Provider   amoxicillin (AMOXIL) 875 MG tablet Take 1 tablet (875 mg total) by mouth 2 (two) times daily. 20 tablet Robyn Haber, MD   HYDROcodone-acetaminophen (NORCO) 5-325 MG tablet Take 1 tablet by mouth every 6 (six) hours as needed for moderate pain. 10 tablet Robyn Haber, MD  I have reviewed the PDMP during this encounter.   Elvina Sidle, MD 03/31/19 1327

## 2019-03-31 NOTE — Discharge Instructions (Addendum)
Try to get in with the dentist soon.  Use ibuprofen during day and hydrocodone at night.

## 2019-04-03 IMAGING — US US MFM FETAL BPP W/O NON-STRESS
1 series · 14 of 24 positions shown · non-contrast
Comparison: none

[Series 1: us mfm fetal bpp w/o non-stress · 24 acquisitions, 14 frames shown]
[im 1/24]
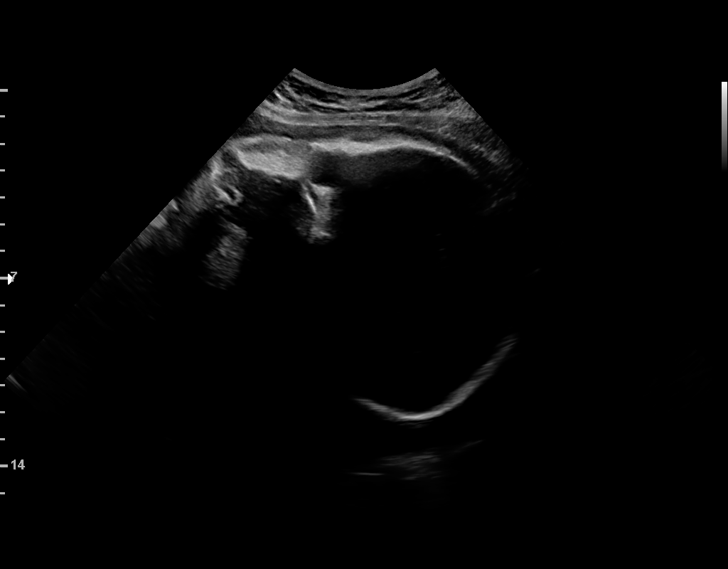
[im 3/24]
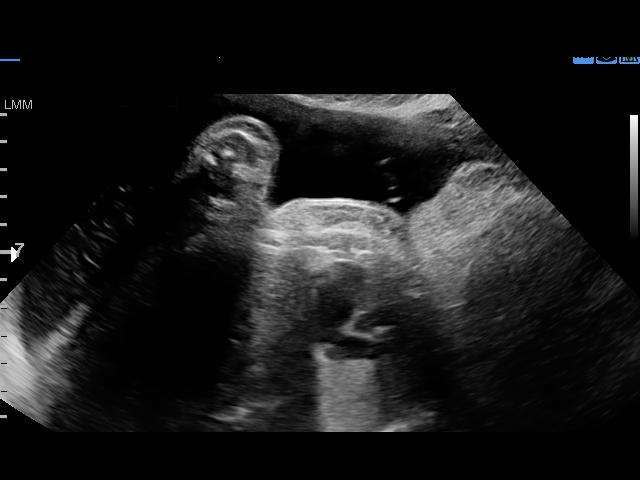
[im 5/24]
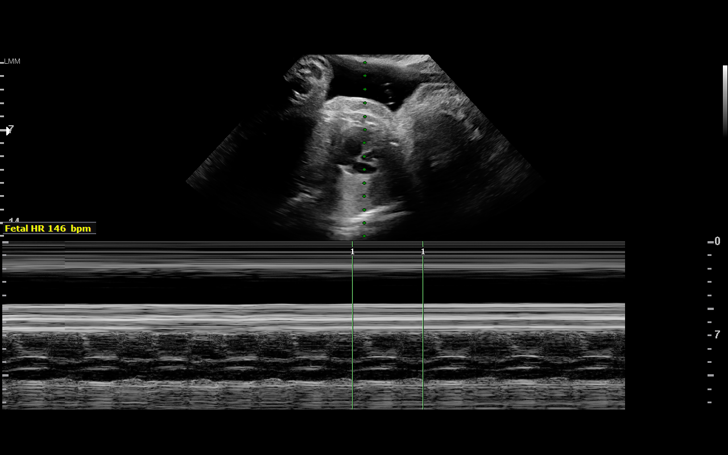
[im 7/24]
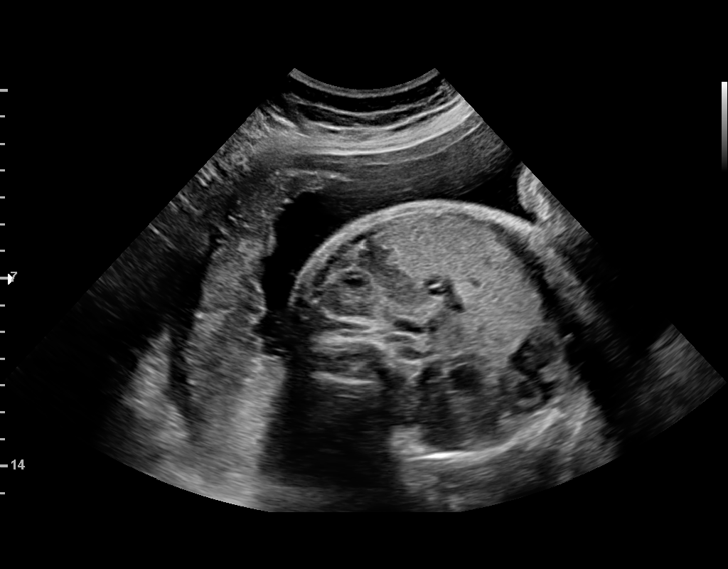
[im 8/24]
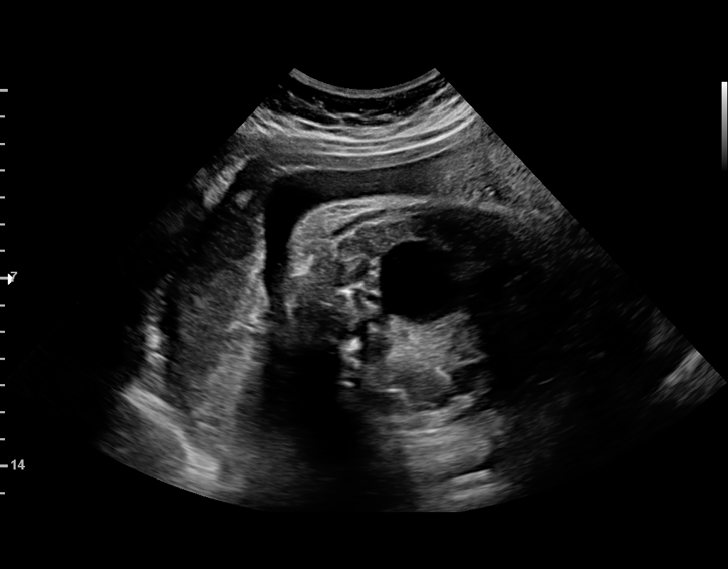
[im 10/24]
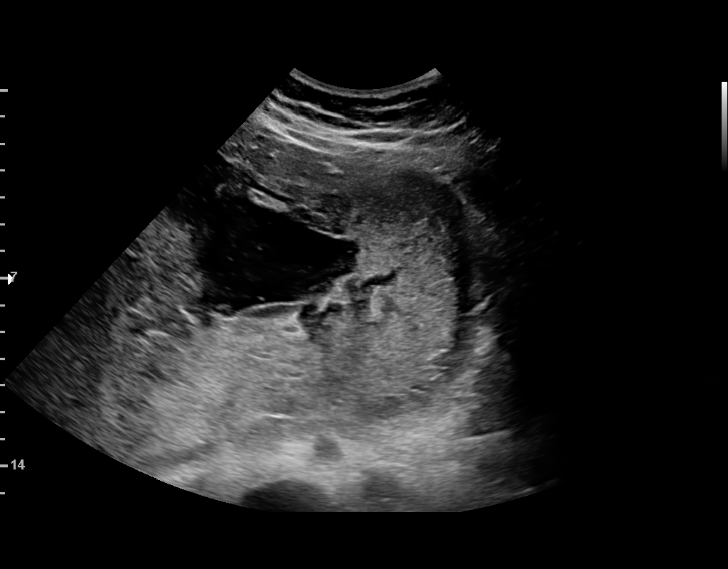
[im 12/24]
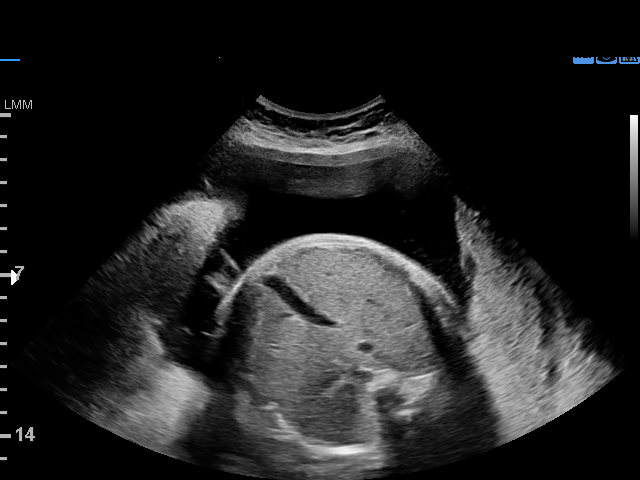
[im 13/24]
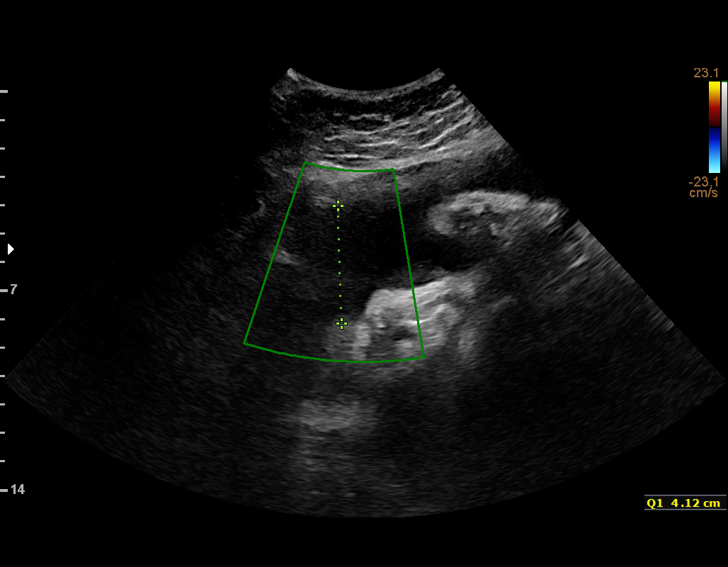
[im 15/24]
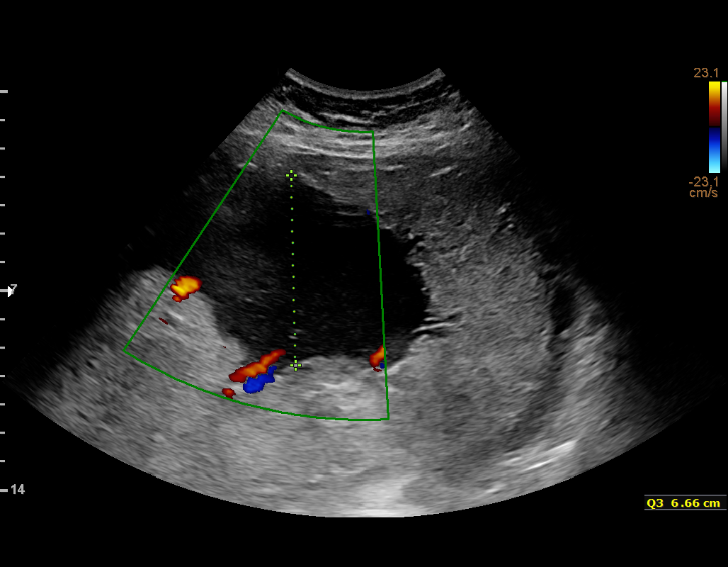
[im 17/24]
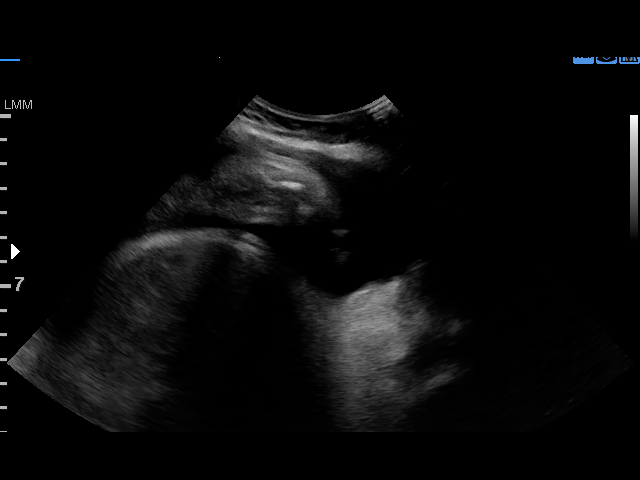
[im 19/24]
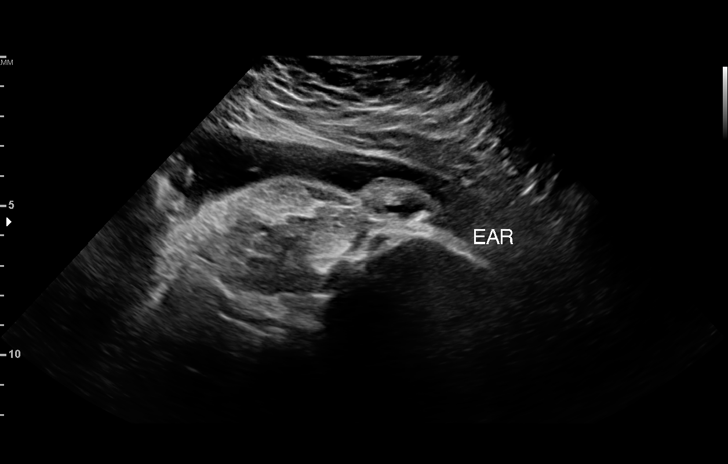
[im 20/24]
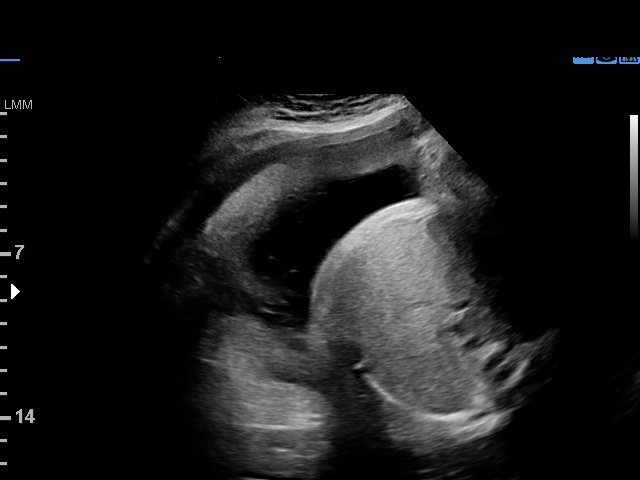
[im 22/24]
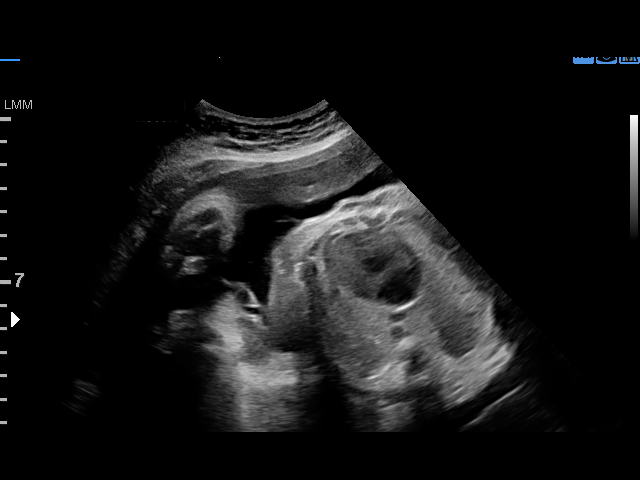
[im 24/24]
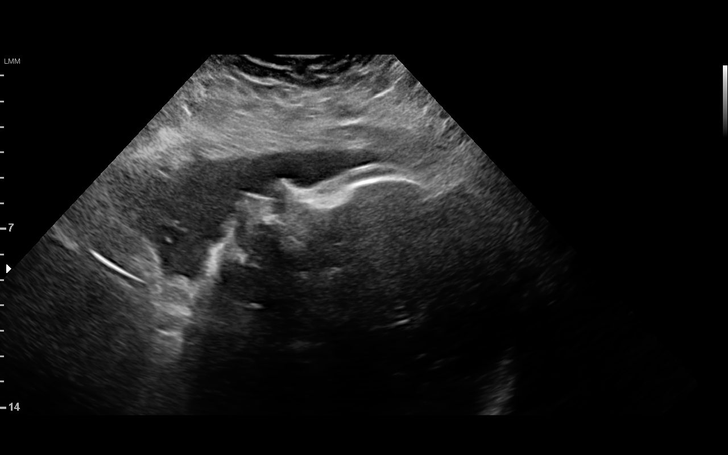

[14 of 24 positions shown; findings below may reference images not displayed]

[REDACTED]

1  SHALLEY JIM            [id number]      [id number]     [id number]
Indications

37 weeks gestation of pregnancy
Obesity complicating pregnancy, third
trimester
Medical complication of pregnancy
(pseudotumor cerebri)
Polyhydramnios, third trimester, antepartum
condition or complication, unspecified fetus
OB History

Blood Type:            Height:  5'4"   Weight (lb):  225      BMI:
Gravidity:    1
Fetal Evaluation

Num Of Fetuses:     1
Fetal Heart         146
Rate(bpm):
Cardiac Activity:   Observed
Presentation:       Cephalic
Placenta:           Fundal, above cervical os

Amniotic Fluid
AFI FV:      Subjectively upper-normal

AFI Sum(cm)     %Tile       Largest Pocket(cm)
20.69           80
RUQ(cm)       RLQ(cm)       LUQ(cm)        LLQ(cm)
4.12
Biophysical Evaluation

Amniotic F.V:   Pocket => 2 cm two         F. Tone:        Observed
planes
F. Movement:    Observed                   Score:          [DATE]
F. Breathing:   Observed
Gestational Age

LMP:           42w 6d       Date:   12/13/15                 EDD:   09/18/16
Best:          37w 1d    Det. By:   Early Ultrasound         EDD:   10/28/16
(03/11/16)
Impression

Single living intrauterine pregnancy at 37+1 weeks.
High normal amniotic fluid volume.
BPP [DATE].
Recommendations

Continue scheduled antepartum testing

## 2019-06-12 DIAGNOSIS — Z20828 Contact with and (suspected) exposure to other viral communicable diseases: Secondary | ICD-10-CM | POA: Diagnosis not present

## 2019-06-12 DIAGNOSIS — Z03818 Encounter for observation for suspected exposure to other biological agents ruled out: Secondary | ICD-10-CM | POA: Diagnosis not present

## 2019-10-16 ENCOUNTER — Emergency Department (INDEPENDENT_AMBULATORY_CARE_PROVIDER_SITE_OTHER)
Admission: RE | Admit: 2019-10-16 | Discharge: 2019-10-16 | Disposition: A | Discharge status: Home / Self Care | Payer: Medicaid Other | Source: Ambulatory Visit

## 2019-10-16 ENCOUNTER — Other Ambulatory Visit: Payer: Self-pay

## 2019-10-16 VITALS — BP 153/80 | HR 104 | Temp 98.2°F | Resp 16 | Ht 64.0 in | Wt 260.0 lb

## 2019-10-16 DIAGNOSIS — K029 Dental caries, unspecified: Secondary | ICD-10-CM | POA: Diagnosis not present

## 2019-10-16 MED ORDER — AMOXICILLIN 500 MG PO CAPS: 500.0000 mg | ORAL_CAPSULE | Freq: Three times a day (TID) | ORAL | 0 refills | Status: DC

## 2019-10-16 MED ORDER — IBUPROFEN 600 MG PO TABS: 600.0000 mg | ORAL_TABLET | Freq: Three times a day (TID) | ORAL | 0 refills | Status: DC | PRN

## 2019-10-16 NOTE — Discharge Instructions (Signed)
  Please take antibiotics as prescribed and be sure to complete entire course even if you start to feel better to ensure infection does not come back.  You may take 500mg  acetaminophen every 4-6 hours or in combination with ibuprofen 400-600mg  every 6-8 hours as needed for pain, inflammation, and fever.  Be to stay well hydrated and get at least 8 hours of sleep at night, preferably more while sick.   Call to schedule a follow up appointment with your dentist later this week.

## 2019-10-16 NOTE — ED Triage Notes (Signed)
Patient is still breast feeding. 

## 2019-10-16 NOTE — ED Triage Notes (Signed)
Patient here with pain in upper right back of mouth more intense over past week; aware of need for dental care but without sitter. Has been vaccinated for covid.

## 2019-10-16 NOTE — ED Provider Notes (Signed)
[facility]    CSN: [id number] Arrival date & time: 10/16/19  1404      History   Chief Complaint Chief Complaint  Patient presents with  . Dental Pain    HPI Madison Guzman is a 24 y.o. female.   HPI Madison Guzman is a 24 y.o. female presenting to UC with hx of dental abscess c/o 1 week of gradually worsening Right upper tooth and gum pain. She knows she needs a root canal but has not been able to schedule a dental visit due to lack of childcare.  Pain is aching and throbbing, 3/10 at this time, radiating into Right ear. Denies fever, chills, n/v/d. Pt is currently breastfeeding.    Past Medical History:  Diagnosis Date  . Anxiety   . Asthma   . Depression   . Migraine    Pt reports since age of 12  . Pharyngitis 01/08/2013   [facility] - strep neg    Patient Active Problem List   Diagnosis Date Noted  . SVD (spontaneous vaginal delivery) 07/26/2018  . Indication for care in labor or delivery 07/25/2018  . Encounter for induction of labor 07/25/2018  . Back pain in pregnancy 07/07/2018  . Braxton Hicks contractions 07/07/2018  . Obesity in pregnancy 06/03/2016  . Supervision of normal pregnancy 03/11/2016  . Pseudotumor cerebri syndrome 09/22/2014  . Chronic daily headache 06/16/2013  . Migraine without aura and without status migrainosus, not intractable 12/01/2012  . Anxiety state, unspecified 12/01/2012  . Insomnia 12/01/2012  . Depression 09/16/2012  . Asthma, moderate persistent, well-controlled 09/16/2012    Past Surgical History:  Procedure Laterality Date  . CHOLECYSTECTOMY    . TYMPANOSTOMY TUBE PLACEMENT Bilateral     OB History    Gravida  2   Para  2   Term  2   Preterm  0   AB  0   Living  2     SAB  0   TAB  0   Ectopic  0   Multiple      Live Births  2            Home Medications    Prior to Admission medications   Medication Sig Start Date End Date Taking? Authorizing Provider    acetaminophen (TYLENOL) 325 MG tablet Take 650 mg by mouth every 6 (six) hours as needed.    [provider]  amoxicillin (AMOXIL) 500 MG capsule Take 1 capsule (500 mg total) by mouth 3 (three) times daily. 10/16/19   Lurene Shadow, PA-C  HYDROcodone-acetaminophen (NORCO) 5-325 MG tablet Take 1 tablet by mouth every 6 (six) hours as needed for moderate pain. 03/31/19   Elvina Sidle, MD  ibuprofen (ADVIL) 600 MG tablet Take 1 tablet (600 mg total) by mouth every 8 (eight) hours as needed for moderate pain or cramping. 10/16/19   Lurene Shadow, PA-C    Family History Family History  Problem Relation Age of Onset  . Asthma Mother   . Hypertension Mother   . Diabetes Maternal Grandmother   . Hyperlipidemia Maternal Grandmother   . Hypertension Maternal Grandmother   . Mental illness Maternal Grandmother   . Migraines Maternal Grandmother   . Seizures Father   . ADD / ADHD Brother        1 Younger brother has ADHD  . Mental illness Maternal Aunt        Bipolar  . ADD / ADHD Maternal Uncle  Social History Social History   Tobacco Use  . Smoking status: Passive Smoke Exposure - Never Smoker  . Smokeless tobacco: Never Used  Vaping Use  . Vaping Use: Never used  Substance Use Topics  . Alcohol use: No    Alcohol/week: 0.0 standard drinks  . Drug use: No     Allergies   Patient has no known allergies.   Review of Systems Review of Systems  Constitutional: Negative for chills and fever.  HENT: Positive for dental problem. Negative for congestion and sore throat.   Gastrointestinal: Negative for nausea and vomiting.     Physical Exam Triage Vital Signs ED Triage Vitals  Enc Vitals Group     BP 10/16/19 1428 (!) 153/80     Pulse Rate 10/16/19 1428 (!) 104     Resp 10/16/19 1428 16     Temp 10/16/19 1428 98.2 F (36.8 C)     Temp Source 10/16/19 1428 Oral     SpO2 10/16/19 1428 99 %     Weight 10/16/19 1434 260 lb (117.9 kg)     Height 10/16/19 1434  5\' 4"  (1.626 m)     Head Circumference --      Peak Flow --      Pain Score 10/16/19 1434 3     Pain Loc --      Pain Edu? --      Excl. in GC? --    No data found.  Updated Vital Signs BP (!) 153/80 (BP Location: Left Wrist)   Pulse (!) 104   Temp 98.2 F (36.8 C) (Oral)   Resp 16   Ht 5\' 4"  (1.626 m)   Wt 260 lb (117.9 kg)   SpO2 99%   Breastfeeding Yes   BMI 44.63 kg/m   Visual Acuity Right Eye Distance:   Left Eye Distance:   Bilateral Distance:    Right Eye Near:   Left Eye Near:    Bilateral Near:     Physical Exam Vitals and nursing note reviewed.  Constitutional:      General: She is not in acute distress.    Appearance: Normal appearance. She is well-developed. She is not ill-appearing, toxic-appearing or diaphoretic.  HENT:     Head: Normocephalic and atraumatic.     Right Ear: Tympanic membrane and ear canal normal.     Left Ear: Tympanic membrane and ear canal normal.     Nose: Nose normal.     Mouth/Throat:     Lips: Pink.     Mouth: Mucous membranes are moist.     Dentition: Abnormal dentition. Dental tenderness, gingival swelling and dental caries present. No gum lesions.     Pharynx: Oropharynx is clear. Uvula midline.   Cardiovascular:     Rate and Rhythm: Normal rate.  Pulmonary:     Effort: Pulmonary effort is normal.  Musculoskeletal:        General: Normal range of motion.     Cervical back: Normal range of motion.  Skin:    General: Skin is warm and dry.  Neurological:     Mental Status: She is alert and oriented to person, place, and time.  Psychiatric:        Behavior: Behavior normal.      UC Treatments / Results  Labs (all labs ordered are listed, but only abnormal results are displayed) Labs Reviewed - No data to display  EKG   Radiology No results found.  Procedures Procedures (including critical care time)  Medications Ordered in UC Medications - No data to display  Initial Impression / Assessment and Plan  / UC Course  I have reviewed the triage vital signs and the nursing notes.  Pertinent labs & imaging results that were available during my care of the patient were reviewed by me and considered in my medical decision making (see chart for details).     Hx and exam c/w early dental abscess Will start pt on amoxicillin Encouraged f/u with dentist in 1-2 weeks AVS given  Final Clinical Impressions(s) / UC Diagnoses   Final diagnoses:  Pain due to dental caries     Discharge Instructions      Please take antibiotics as prescribed and be sure to complete entire course even if you start to feel better to ensure infection does not come back.  You may take 500mg  acetaminophen every 4-6 hours or in combination with ibuprofen 400-600mg  every 6-8 hours as needed for pain, inflammation, and fever.  Be to stay well hydrated and get at least 8 hours of sleep at night, preferably more while sick.   Call to schedule a follow up appointment with your dentist later this week.     ED Prescriptions    Medication Sig Dispense Auth. Provider   amoxicillin (AMOXIL) 500 MG capsule Take 1 capsule (500 mg total) by mouth 3 (three) times daily. 21 capsule , Taysen Bushart O, PA-C   ibuprofen (ADVIL) 600 MG tablet Take 1 tablet (600 mg total) by mouth every 8 (eight) hours as needed for moderate pain or cramping. 20 tablet Doroteo Glassman, Lurene Shadow     PDMP not reviewed this encounter.   [place], Lurene Shadow 10/17/19 1040

## 2019-11-18 ENCOUNTER — Emergency Department: Admit: 2019-11-18 | Discharge status: Absent without leave | Payer: Self-pay

## 2019-11-18 ENCOUNTER — Emergency Department (INDEPENDENT_AMBULATORY_CARE_PROVIDER_SITE_OTHER)
Admission: RE | Admit: 2019-11-18 | Discharge: 2019-11-18 | Disposition: A | Discharge status: Home / Self Care | Payer: Medicaid Other | Source: Ambulatory Visit

## 2019-11-18 ENCOUNTER — Other Ambulatory Visit: Payer: Self-pay

## 2019-11-18 VITALS — BP 123/84 | HR 90 | Temp 98.9°F | Resp 15

## 2019-11-18 DIAGNOSIS — N3000 Acute cystitis without hematuria: Secondary | ICD-10-CM | POA: Diagnosis not present

## 2019-11-18 LAB — POCT URINALYSIS DIP (MANUAL ENTRY)
Bilirubin, UA: NEGATIVE
Blood, UA: NEGATIVE
Glucose, UA: NEGATIVE mg/dL
Ketones, POC UA: NEGATIVE mg/dL
Nitrite, UA: NEGATIVE
Protein Ur, POC: NEGATIVE mg/dL
Spec Grav, UA: 1.02 (ref 1.010–1.025)
Urobilinogen, UA: 0.2 E.U./dL
pH, UA: 6.5 (ref 5.0–8.0)

## 2019-11-18 MED ORDER — NITROFURANTOIN MONOHYD MACRO 100 MG PO CAPS: 100.0000 mg | ORAL_CAPSULE | Freq: Two times a day (BID) | ORAL | 0 refills | Status: AC

## 2019-11-18 NOTE — ED Provider Notes (Signed)
[facility]    CSN: [id number] Arrival date & time: 11/18/19  0902      History   Chief Complaint Chief Complaint  Patient presents with  . Appointment  . Dysuria    HPI Madison Guzman is a 24 y.o. female.   HPI  Presents for evaluation of urinary frequency, urgency and dysuria x 5 days without flank pain days, without flank pain, fever, chills, or abnormal vaginal discharge or bleeding.  Patient endorses some mild back pain.  She denies any history of recurrent UTIs she did have one during pregnancy however has not had any history of [place] or urosepsis.  Patient is currently breast-feeding 72-year-old toddler. Past Medical History:  Diagnosis Date  . Anxiety   . Asthma   . Depression   . Migraine    Pt reports since age of 35  . Pharyngitis 01/08/2013   [facility] - strep neg    Patient Active Problem List   Diagnosis Date Noted  . SVD (spontaneous vaginal delivery) 07/26/2018  . Indication for care in labor or delivery 07/25/2018  . Encounter for induction of labor 07/25/2018  . Back pain in pregnancy 07/07/2018  . Braxton Hicks contractions 07/07/2018  . Obesity in pregnancy 06/03/2016  . Supervision of normal pregnancy 03/11/2016  . Pseudotumor cerebri syndrome 09/22/2014  . Chronic daily headache 06/16/2013  . Migraine without aura and without status migrainosus, not intractable 12/01/2012  . Anxiety state, unspecified 12/01/2012  . Insomnia 12/01/2012  . Depression 09/16/2012  . Asthma, moderate persistent, well-controlled 09/16/2012    Past Surgical History:  Procedure Laterality Date  . CHOLECYSTECTOMY    . TYMPANOSTOMY TUBE PLACEMENT Bilateral     OB History    Gravida  2   Para  2   Term  2   Preterm  0   AB  0   Living  2     SAB  0   TAB  0   Ectopic  0   Multiple      Live Births  2            Home Medications    Prior to Admission medications   Medication Sig Start Date End Date Taking?  Authorizing Provider  acetaminophen (TYLENOL) 325 MG tablet Take 650 mg by mouth every 6 (six) hours as needed.    [provider]  amoxicillin (AMOXIL) 500 MG capsule Take 1 capsule (500 mg total) by mouth 3 (three) times daily. 10/16/19   Lurene Shadow, PA-C  HYDROcodone-acetaminophen (NORCO) 5-325 MG tablet Take 1 tablet by mouth every 6 (six) hours as needed for moderate pain. 03/31/19   Elvina Sidle, MD  ibuprofen (ADVIL) 600 MG tablet Take 1 tablet (600 mg total) by mouth every 8 (eight) hours as needed for moderate pain or cramping. 10/16/19   Lurene Shadow, PA-C    Family History Family History  Problem Relation Age of Onset  . Asthma Mother   . Hypertension Mother   . Diabetes Maternal Grandmother   . Hyperlipidemia Maternal Grandmother   . Hypertension Maternal Grandmother   . Mental illness Maternal Grandmother   . Migraines Maternal Grandmother   . Seizures Father   . ADD / ADHD Brother        1 Younger brother has ADHD  . Mental illness Maternal Aunt        Bipolar  . ADD / ADHD Maternal Uncle     Social History Social History  Tobacco Use  . Smoking status: Passive Smoke Exposure - Never Smoker  . Smokeless tobacco: Never Used  Vaping Use  . Vaping Use: Never used  Substance Use Topics  . Alcohol use: No    Alcohol/week: 0.0 standard drinks  . Drug use: No     Allergies   Patient has no known allergies.   Review of Systems Review of Systems Pertinent negatives listed in HPI  Physical Exam Triage Vital Signs ED Triage Vitals  Enc Vitals Group     BP 11/18/19 0919 123/84     Pulse Rate 11/18/19 0919 90     Resp 11/18/19 0919 15     Temp 11/18/19 0919 98.9 F (37.2 C)     Temp Source 11/18/19 0919 Oral     SpO2 11/18/19 0919 99 %     Weight --      Height --      Head Circumference --      Peak Flow --      Pain Score 11/18/19 0920 4     Pain Loc --      Pain Edu? --      Excl. in GC? --    No data found.  Updated Vital  Signs BP 123/84 (BP Location: Left Arm)   Pulse 90   Temp 98.9 F (37.2 C) (Oral)   Resp 15   SpO2 99%   Breastfeeding Yes   Visual Acuity Right Eye Distance:   Left Eye Distance:   Bilateral Distance:    Right Eye Near:   Left Eye Near:    Bilateral Near:     Physical Exam General appearance: alert, well developed, well nourished, cooperative and in no distress Head: Normocephalic, without obvious abnormality, atraumatic Respiratory: Respirations even and unlabored, normal respiratory rate Heart: rate and rhythm normal.  Abdomen: Nondistended, no CVA tenderness Musculoskeletal: Bilateral lower lumbar tenderness Skin: Skin color, texture, turgor normal. No rashes seen  Psych: Appropriate mood and affect.  UC Treatments / Results  Labs (all labs ordered are listed, but only abnormal results are displayed) Labs Reviewed  POCT URINALYSIS DIP (MANUAL ENTRY) - Abnormal; Notable for the following components:      Result Value   Color, UA yellow (*)    Clarity, UA cloudy (*)    Leukocytes, UA Small (1+) (*)    All other components within normal limits    EKG   Radiology No results found.  Procedures Procedures (including critical care time)  Medications Ordered in UC Medications - No data to display  Initial Impression / Assessment and Plan / UC Course  I have reviewed the triage vital signs and the nursing notes.  Pertinent labs & imaging results that were available during my care of the patient were reviewed by me and considered in my medical decision making (see chart for details).    Acute uncomplicated UTI.  Urine culture pending.  Start Macrobid twice daily x7 days.  Will notify patient via MyChart once culture is received if any changes in therapy.  Encourage hydration.  Red flags discussed.  Patient verbalized understanding of plan Final Clinical Impressions(s) / UC Diagnoses   Final diagnoses:  Acute cystitis without hematuria     Discharge  Instructions     Complete all antibiotics as prescribed.  Hydrate well with water to flush bladder.  If fever, nausea persistent vomiting or severe back pain develops go immediately to the emergency department as these are symptoms of severe complicated urinary tract infection.  ED Prescriptions    Medication Sig Dispense Auth. Provider   nitrofurantoin, macrocrystal-monohydrate, (MACROBID) 100 MG capsule Take 1 capsule (100 mg total) by mouth 2 (two) times daily for 7 days. 14 capsule Bing Neighbors, FNP     PDMP not reviewed this encounter.   Bing Neighbors, [place] 11/18/19 [phone]

## 2019-11-18 NOTE — ED Triage Notes (Addendum)
Urgency x 2 days - lower back pain started early this am - took 2 tylenol (1000mg  total) Pain is worse on Left side of abdomen Denies fever or chills Takes tylenol or ibuprofen for migraines  denies pregnancy  Moderna vaccine Seen here for dental pain on 10/16/19 - has to find a dentist - still has pain - finished antibiotcs

## 2019-11-18 NOTE — Discharge Instructions (Addendum)
Complete all antibiotics as prescribed.  Hydrate well with water to flush bladder.  If fever, nausea persistent vomiting or severe back pain develops go immediately to the emergency department as these are symptoms of severe complicated urinary tract infection.

## 2019-11-20 LAB — URINE CULTURE
MICRO NUMBER:: 11049655
SPECIMEN QUALITY:: ADEQUATE

## 2020-03-30 ENCOUNTER — Other Ambulatory Visit: Payer: Self-pay

## 2020-03-30 ENCOUNTER — Emergency Department
Admission: RE | Admit: 2020-03-30 | Discharge: 2020-03-30 | Disposition: A | Discharge status: Home / Self Care | Payer: Medicaid Other | Source: Ambulatory Visit

## 2020-03-30 VITALS — BP 137/75 | HR 97 | Temp 98.1°F | Resp 16

## 2020-03-30 DIAGNOSIS — K047 Periapical abscess without sinus: Secondary | ICD-10-CM

## 2020-03-30 MED ORDER — NAPROXEN 375 MG PO TABS: 375.0000 mg | ORAL_TABLET | Freq: Two times a day (BID) | ORAL | 0 refills | Status: DC

## 2020-03-30 MED ORDER — CHLORHEXIDINE GLUCONATE 0.12 % MT SOLN: 15.0000 mL | Freq: Two times a day (BID) | OROMUCOSAL | 0 refills | Status: AC

## 2020-03-30 MED ORDER — CHLORHEXIDINE GLUCONATE 0.12 % MT SOLN: 15.0000 mL | Freq: Two times a day (BID) | OROMUCOSAL | 0 refills | Status: DC

## 2020-03-30 MED ORDER — AMOXICILLIN 875 MG PO TABS: 875.0000 mg | ORAL_TABLET | Freq: Two times a day (BID) | ORAL | 0 refills | Status: DC

## 2020-03-30 NOTE — ED Triage Notes (Signed)
Patient presents to Urgent Care with complaints of dental pain on the top right for "a long time". Patient reports she sometimes has pain on both sides, thinks she has an infection on the top right side where a tooth recently broke off.

## 2020-03-30 NOTE — ED Provider Notes (Signed)
[facility]    CSN: [id number] Arrival date & time: 03/30/20  1055      History   Chief Complaint Chief Complaint  Patient presents with  . Dental Pain    HPI Madison Guzman is a 26 y.o. female.   HPI  Patient presents today for for evaluation of dental pain involving the upper gum and tooth number # 13 and lower left molar (wisdom tooth). Patient has been seen previously here for dental infection.  Patient reports she has an appointment to see a dental provider next month.  She is afebrile.  Negative of facial swelling. She has been managing pain with over-the-counter medication.  Past Medical History:  Diagnosis Date  . Anxiety   . Asthma   . Depression   . Migraine    Pt reports since age of 58  . Pharyngitis 01/08/2013   [facility] - strep neg    Patient Active Problem List   Diagnosis Date Noted  . SVD (spontaneous vaginal delivery) 07/26/2018  . Indication for care in labor or delivery 07/25/2018  . Encounter for induction of labor 07/25/2018  . Back pain in pregnancy 07/07/2018  . Braxton Hicks contractions 07/07/2018  . Obesity in pregnancy 06/03/2016  . Supervision of normal pregnancy 03/11/2016  . Pseudotumor cerebri syndrome 09/22/2014  . Chronic daily headache 06/16/2013  . Migraine without aura and without status migrainosus, not intractable 12/01/2012  . Anxiety state, unspecified 12/01/2012  . Insomnia 12/01/2012  . Depression 09/16/2012  . Asthma, moderate persistent, well-controlled 09/16/2012    Past Surgical History:  Procedure Laterality Date  . CHOLECYSTECTOMY    . TYMPANOSTOMY TUBE PLACEMENT Bilateral     OB History    Gravida  2   Para  2   Term  2   Preterm  0   AB  0   Living  2     SAB  0   IAB  0   Ectopic  0   Multiple      Live Births  2            Home Medications    Prior to Admission medications   Medication Sig Start Date End Date Taking? Authorizing Provider  amoxicillin  (AMOXIL) 875 MG tablet Take 1 tablet (875 mg total) by mouth 2 (two) times daily. 03/30/20  Yes Bing Neighbors, FNP  chlorhexidine (PERIDEX) 0.12 % solution Use as directed 15 mLs in the mouth or throat 2 (two) times daily. 03/30/20  Yes Bing Neighbors, FNP  naproxen (NAPROSYN) 375 MG tablet Take 1 tablet (375 mg total) by mouth 2 (two) times daily. 03/30/20  Yes Bing Neighbors, FNP  acetaminophen (TYLENOL) 325 MG tablet Take 650 mg by mouth every 6 (six) hours as needed.    [provider]  HYDROcodone-acetaminophen (NORCO) 5-325 MG tablet Take 1 tablet by mouth every 6 (six) hours as needed for moderate pain. 03/31/19   Elvina Sidle, MD  ibuprofen (ADVIL) 600 MG tablet Take 1 tablet (600 mg total) by mouth every 8 (eight) hours as needed for moderate pain or cramping. 10/16/19   Lurene Shadow, PA-C    Family History Family History  Problem Relation Age of Onset  . Asthma Mother   . Hypertension Mother   . Diabetes Maternal Grandmother   . Hyperlipidemia Maternal Grandmother   . Hypertension Maternal Grandmother   . Mental illness Maternal Grandmother   . Migraines Maternal Grandmother   . Seizures  Father   . ADD / ADHD Brother        1 Younger brother has ADHD  . Mental illness Maternal Aunt        Bipolar  . ADD / ADHD Maternal Uncle     Social History Social History   Tobacco Use  . Smoking status: Passive Smoke Exposure - Never Smoker  . Smokeless tobacco: Never Used  Vaping Use  . Vaping Use: Never used  Substance Use Topics  . Alcohol use: No    Alcohol/week: 0.0 standard drinks  . Drug use: No     Allergies   Patient has no known allergies.   Review of Systems Review of Systems Pertinent negatives listed in HPI Physical Exam Triage Vital Signs ED Triage Vitals  Enc Vitals Group     BP 03/30/20 1108 137/75     Pulse Rate 03/30/20 1108 97     Resp 03/30/20 1108 16     Temp 03/30/20 1108 98.1 F (36.7 C)     Temp Source 03/30/20 1108  Oral     SpO2 03/30/20 1108 98 %     Weight --      Height --      Head Circumference --      Peak Flow --      Pain Score 03/30/20 1107 7     Pain Loc --      Pain Edu? --      Excl. in GC? --    No data found.  Updated Vital Signs BP 137/75 (BP Location: Left Arm)   Pulse 97   Temp 98.1 F (36.7 C) (Oral)   Resp 16   SpO2 98%   Visual Acuity Right Eye Distance:   Left Eye Distance:   Bilateral Distance:    Right Eye Near:   Left Eye Near:    Bilateral Near:     Physical Exam General appearance: alert, well developed, well nourished, cooperative  Head: Normocephalic, without obvious abnormality, atraumatic Mouth: Dental caries present, gingival swelling present,  Respiratory: Respirations even and unlabored, normal respiratory rate Heart: rate and rhythm normal. No gallop or murmurs noted on exam  Abdomen: BS +, no distention, no rebound tenderness, or no mass Extremities: No gross deformities Skin: Skin color, texture, turgor normal. No rashes seen  Psych: Appropriate mood and affect. Neurologic: GCS 15, normal gait, normal coordination UC Treatments / Results  Labs (all labs ordered are listed, but only abnormal results are displayed) Labs Reviewed - No data to display  EKG   Radiology No results found.  Procedures Procedures (including critical care time)  Medications Ordered in UC Medications - No data to display  Initial Impression / Assessment and Plan / UC Course  I have reviewed the triage vital signs and the nursing notes.  Pertinent labs & imaging results that were available during my care of the patient were reviewed by me and considered in my medical decision making (see chart for details).     Patient presents with dental infection will cover with amoxicillin twice daily for 10 days. Peridex oral rinse prescribed to cleanse teeth and gums twice daily for 10 days.  Naproxen 375 as needed for pain.  Keep follow-up scheduled with  dentist.  Final Clinical Impressions(s) / UC Diagnoses   Final diagnoses:  Dental infection   Discharge Instructions   None    ED Prescriptions    Medication Sig Dispense Auth. Provider   amoxicillin (AMOXIL) 875 MG tablet Take 1  tablet (875 mg total) by mouth 2 (two) times daily. 20 tablet Bing Neighbors, FNP   naproxen (NAPROSYN) 375 MG tablet Take 1 tablet (375 mg total) by mouth 2 (two) times daily. 20 tablet Bing Neighbors, FNP   chlorhexidine (PERIDEX) 0.12 % solution  (Status: Discontinued) Use as directed 15 mLs in the mouth or throat 2 (two) times daily. 120 mL Bing Neighbors, FNP   chlorhexidine (PERIDEX) 0.12 % solution Use as directed 15 mLs in the mouth or throat 2 (two) times daily. 120 mL Bing Neighbors, FNP     PDMP not reviewed this encounter.   Bing Neighbors, FNP 03/30/20 1246

## 2020-08-29 DIAGNOSIS — F411 Generalized anxiety disorder: Secondary | ICD-10-CM | POA: Diagnosis not present

## 2020-10-08 ENCOUNTER — Ambulatory Visit (INDEPENDENT_AMBULATORY_CARE_PROVIDER_SITE_OTHER): Payer: Medicaid Other | Admitting: Obstetrics & Gynecology

## 2020-10-08 ENCOUNTER — Other Ambulatory Visit: Payer: Self-pay

## 2020-10-08 ENCOUNTER — Encounter: Payer: Self-pay | Admitting: Obstetrics & Gynecology

## 2020-10-08 VITALS — BP 113/78 | HR 95 | Ht 64.0 in

## 2020-10-08 DIAGNOSIS — N63 Unspecified lump in unspecified breast: Secondary | ICD-10-CM | POA: Diagnosis not present

## 2020-10-08 DIAGNOSIS — F419 Anxiety disorder, unspecified: Secondary | ICD-10-CM

## 2020-10-08 DIAGNOSIS — Z3009 Encounter for other general counseling and advice on contraception: Secondary | ICD-10-CM | POA: Diagnosis not present

## 2020-10-08 MED ORDER — NORETHIN-ETH ESTRAD-FE BIPHAS 1 MG-10 MCG / 10 MCG PO TABS: 1.0000 | ORAL_TABLET | Freq: Every day | ORAL | 11 refills | Status: AC

## 2020-10-08 NOTE — Progress Notes (Signed)
Pt declined having weight taken  Pt feels lumps in both breasts- denies nipple discharge

## 2020-10-08 NOTE — Progress Notes (Signed)
   Subjective:    Patient ID: Madison Guzman, female    DOB: Dec 06, 1995, 25 y.o.   MRN: [id number]  HPI  25 year old female presents for bilateral breast masses.  Patient has been followed for right breast mass in the past.  They told her was a galactocele when she was breast-feeding her prior child.  She just finished breast-feeding her 59-year-old.  The right breast mass is still present and painful.  There are 2 new left breast masses.  These are also painful and feel cordlike.  Patient has occasional expression of milk from her nipple.  No other skin changes.  Her grandmother is undergoing a work-up to see if she has breast cancer which is prompting a thorough evaluation today.  Patient would also like to go on combination oral contraceptives.  She has been on low Loestrin in the past and would like to try that again.  She would like to do continuous oral contraceptives.  Review of Systems  Constitutional: Negative.   Respiratory: Negative.    Cardiovascular: Negative.   Gastrointestinal: Negative.   Genitourinary: Negative.       Objective:   Physical Exam Vitals reviewed.  Constitutional:      General: She is not in acute distress.    Appearance: She is well-developed.  HENT:     Head: Normocephalic and atraumatic.  Eyes:     Conjunctiva/sclera: Conjunctivae normal.  Cardiovascular:     Rate and Rhythm: Normal rate.  Pulmonary:     Effort: Pulmonary effort is normal.  Chest:    Skin:    General: Skin is warm and dry.  Neurological:     Mental Status: She is alert and oriented to person, place, and time.  Psychiatric:        Mood and Affect: Mood normal.  Vitals:   10/08/20 1026  BP: 113/78  Pulse: 95  Height: 5\' 4"  (1.626 m)    Assessment & Plan:  25 year old female with bilateral breast masses. Patient able to localize mass and I concur with findings.  Bilateral diagnostic mammograms and ultrasounds ordered. Initiation of continuous OCPs for cycle control and  birth control Pharmacy information not crossing well.  Cannot see her antidepressants.  This has been resolved during this visit.  30 minutes spent with patient during exam, counseling, coordination of care, and documentation.

## 2020-10-17 ENCOUNTER — Other Ambulatory Visit: Payer: Medicaid Other

## 2020-10-30 ENCOUNTER — Ambulatory Visit
Admission: RE | Admit: 2020-10-30 | Discharge: 2020-10-30 | Disposition: A | Discharge status: Home / Self Care | Payer: Medicaid Other | Source: Ambulatory Visit | Attending: Obstetrics & Gynecology | Admitting: Obstetrics & Gynecology

## 2020-10-30 ENCOUNTER — Other Ambulatory Visit: Payer: Self-pay

## 2020-10-30 ENCOUNTER — Other Ambulatory Visit: Payer: Self-pay | Admitting: Obstetrics & Gynecology

## 2020-10-30 DIAGNOSIS — N63 Unspecified lump in unspecified breast: Secondary | ICD-10-CM

## 2020-10-30 DIAGNOSIS — N6311 Unspecified lump in the right breast, upper outer quadrant: Secondary | ICD-10-CM | POA: Diagnosis not present

## 2020-10-30 DIAGNOSIS — N6310 Unspecified lump in the right breast, unspecified quadrant: Secondary | ICD-10-CM

## 2020-11-08 ENCOUNTER — Other Ambulatory Visit: Payer: Medicaid Other

## 2020-11-10 ENCOUNTER — Encounter: Payer: Self-pay | Admitting: Obstetrics & Gynecology

## 2020-11-10 DIAGNOSIS — N6310 Unspecified lump in the right breast, unspecified quadrant: Secondary | ICD-10-CM | POA: Insufficient documentation

## 2021-04-07 DIAGNOSIS — H6693 Otitis media, unspecified, bilateral: Secondary | ICD-10-CM | POA: Diagnosis not present

## 2022-05-09 DIAGNOSIS — H6992 Unspecified Eustachian tube disorder, left ear: Secondary | ICD-10-CM | POA: Diagnosis not present

## 2022-11-20 DIAGNOSIS — K047 Periapical abscess without sinus: Secondary | ICD-10-CM | POA: Diagnosis not present

## 2022-12-03 DIAGNOSIS — F411 Generalized anxiety disorder: Secondary | ICD-10-CM | POA: Diagnosis not present

## 2022-12-10 DIAGNOSIS — F411 Generalized anxiety disorder: Secondary | ICD-10-CM | POA: Diagnosis not present

## 2023-01-06 DIAGNOSIS — F411 Generalized anxiety disorder: Secondary | ICD-10-CM | POA: Diagnosis not present

## 2023-03-03 NOTE — Progress Notes (Deleted)
   ANNUAL EXAM Patient name: Madison Guzman MRN [id number]  Date of birth: Sep 17, 1995 Chief Complaint:   No chief complaint on file.  History of Present Illness:   Madison Guzman is a 28 y.o. [phone] female being seen today for a routine annual exam.   Current concerns: Did not have f/u imaging for right breast (was due 04/2021) due to presenting in August 2022 with bilateral breast masses after completing nursing. ***  Current birth control: OCPs  No LMP recorded.   Last Pap/Pap History:  None in EMR or media tab that I could find. ***   Health Maintenance Due  Topic Date Due   Pneumococcal Vaccine 81-82 Years old (1 of 2 - PCV) Never done   Hepatitis C Screening  Never done   Cervical Cancer Screening (Pap smear)  Never done   INFLUENZA VACCINE  09/11/2022   COVID-19 Vaccine (3 - 2024-25 season) 10/12/2022    Review of Systems:   Pertinent items are noted in HPI Denies any headaches, blurred vision, fatigue, shortness of breath, chest pain, abdominal pain, abnormal vaginal discharge/itching/odor/irritation, problems with periods, bowel movements, urination, or intercourse unless otherwise stated above. *** Pertinent History Reviewed:  Reviewed past medical,surgical, social and family history.  Reviewed problem list, medications and allergies. Physical Assessment:  There were no vitals filed for this visit.There is no height or weight on file to calculate BMI.   Physical Examination:  General appearance - well appearing, and in no distress Mental status - alert, oriented to person, place, and time Psych:  She has a normal mood and affect Skin - warm and dry, normal color, no suspicious lesions noted Chest - effort normal Heart - normal rate  Breasts - breasts appear normal, no suspicious masses, no skin or nipple changes or axillary nodes Abdomen - soft, nontender, nondistended, no masses or organomegaly Pelvic -  VULVA: normal appearing vulva with no masses, tenderness  or lesions  VAGINA: normal appearing vagina with normal color and discharge, no lesions  CERVIX: normal appearing cervix without discharge or lesions, no CMT UTERUS: uterus is felt to be normal size, shape, consistency and nontender  ADNEXA: No adnexal masses or tenderness noted. Extremities:  No swelling or varicosities noted  Chaperone present for exam  No results found for this or any previous visit (from the past 24 hours).  Assessment & Plan:  Diagnoses and all orders for this visit:  Encounter for annual routine gynecological examination  Mass of right breast, unspecified quadrant      No orders of the defined types were placed in this encounter.   Meds: No orders of the defined types were placed in this encounter.   Follow-up: No follow-ups on file.  Milas Hock, MD 03/03/2023 4:10 PM

## 2023-03-04 DIAGNOSIS — K029 Dental caries, unspecified: Secondary | ICD-10-CM | POA: Diagnosis not present

## 2023-03-05 ENCOUNTER — Ambulatory Visit: Payer: Self-pay | Admitting: Obstetrics and Gynecology

## 2023-03-05 DIAGNOSIS — Z01419 Encounter for gynecological examination (general) (routine) without abnormal findings: Secondary | ICD-10-CM

## 2023-03-05 DIAGNOSIS — N6310 Unspecified lump in the right breast, unspecified quadrant: Secondary | ICD-10-CM

## 2023-05-07 ENCOUNTER — Ambulatory Visit: Admitting: Obstetrics and Gynecology

## 2023-05-21 ENCOUNTER — Other Ambulatory Visit: Payer: Self-pay | Admitting: Obstetrics and Gynecology

## 2023-05-21 ENCOUNTER — Ambulatory Visit: Admitting: Obstetrics and Gynecology

## 2023-05-21 ENCOUNTER — Encounter: Payer: Self-pay | Admitting: Obstetrics and Gynecology

## 2023-05-21 ENCOUNTER — Other Ambulatory Visit (HOSPITAL_COMMUNITY)
Admission: RE | Admit: 2023-05-21 | Discharge: 2023-05-21 | Disposition: A | Discharge status: Home / Self Care | Source: Ambulatory Visit | Attending: Obstetrics and Gynecology | Admitting: Obstetrics and Gynecology

## 2023-05-21 VITALS — BP 131/84 | HR 84 | Ht 64.0 in | Wt 302.0 lb

## 2023-05-21 DIAGNOSIS — N816 Rectocele: Secondary | ICD-10-CM

## 2023-05-21 DIAGNOSIS — Z01419 Encounter for gynecological examination (general) (routine) without abnormal findings: Secondary | ICD-10-CM | POA: Diagnosis not present

## 2023-05-21 DIAGNOSIS — Z3009 Encounter for other general counseling and advice on contraception: Secondary | ICD-10-CM | POA: Diagnosis not present

## 2023-05-21 DIAGNOSIS — R928 Other abnormal and inconclusive findings on diagnostic imaging of breast: Secondary | ICD-10-CM

## 2023-05-21 DIAGNOSIS — N63 Unspecified lump in unspecified breast: Secondary | ICD-10-CM

## 2023-05-21 DIAGNOSIS — N649 Disorder of breast, unspecified: Secondary | ICD-10-CM

## 2023-05-21 NOTE — Progress Notes (Signed)
 ANNUAL EXAM Patient name: Madison Guzman MRN [id number]  Date of birth: 04/20/1995 Chief Complaint:   Gynecologic Exam (Patient here for pap and to discuss [facility] options. /)  History of Present Illness:   Madison Guzman is a 28 y.o. [phone] female being seen today for a routine annual exam.   Current concerns:   Discussed the use of AI scribe software for clinical note transcription with the patient, who gave verbal consent to proceed.  History of Present Illness Madison Guzman is a 28 year old female who presents with vaginal pain and pressure symptoms. She is accompanied by her child.  She experiences vaginal pain described as an aching sensation similar to a wound, which has become more prominent recently. The pain is most intense during the first three days of her menstrual cycle, which lasts about four to five days. Additionally, she reports a sensation of pressure and a feeling that 'things are going to come out' when squatting, which has been present since the birth of her daughter four years ago.   She inquires about long-term birth control options, expressing concern about hormonal birth control affecting her weight. She is particularly worried about the risk of blood clots, which she associates with previous use of the pill. She is considering an IUD.  She is seeking a referral for a breast exam, as she was advised to have them every six months following a previous mammogram. Her last biopsy was canceled two years ago, and she was informed that it was not necessary at the time, but she was advised to return for follow-up imaging. She prefers to have the imaging done closer to her home in [place] rather than [place].  She has not had a Pap smear before and expresses concern about the pain associated with it. She confirms having received the Gardasil vaccine.    Patient's last menstrual period was 05/07/2023 (approximate).   Last Pap/Pap History:  Gardasil:  Completed Abnormal pap: Never had one.   Review of Systems:   Pertinent items are noted in HPI Denies any headaches, blurred vision, fatigue, shortness of breath, chest pain, abdominal pain, abnormal vaginal discharge/itching/odor/irritation, problems with periods, bowel movements, urination, or intercourse unless otherwise stated above.  Pertinent History Reviewed:  Reviewed past medical,surgical, social and family history.  Reviewed problem list, medications and allergies. Physical Assessment:   Vitals:   05/21/23 1011  BP: 131/84  Pulse: 84  Weight: (!) 302 lb (137 kg)  Height: 5\' 4"  (1.626 m)  Body mass index is 51.84 kg/m.   Physical Examination:  General appearance - well appearing, and in no distress Mental status - alert, oriented to person, place, and time Psych:  She has a normal mood and affect Skin - warm and dry, normal color, no suspicious lesions noted Chest - effort normal Heart - normal rate  Breasts - breasts appear normal, no suspicious masses, no skin or nipple changes or axillary nodes Abdomen - soft, nontender, nondistended, no masses or organomegaly Pelvic -  VULVA: normal appearing vulva with no masses, tenderness or lesions  VAGINA: normal appearing vagina with normal color and discharge, no lesions  CERVIX: normal appearing cervix without discharge or lesions, no CMT UTERUS: uterus is felt to be normal size, shape, consistency and nontender  ADNEXA: No adnexal masses or tenderness noted. Extremities:  No swelling or varicosities noted  Chaperone present for exam  No results found for this or any previous visit (from the past 24 hours).  Assessment &  Plan:   Assessment & Plan Vaginal Pain Intermittent pain during menstruation possibly due to adenomyosis. No prolapse or bladder issues observed. - Consider pelvic floor exercises or physical therapy. - Monitor pain and bleeding patterns.  Posterior vaginal prolapse - Discussed may try home  exercises or PFPT as first line - Prolapse is mild but would be worsened by squats and bowel dysfunction I.e. constipation.   Contraception Counseling Discussed long-term options: Mirena IUD, copper IUD, salpingectomy. Mirena may alleviate symptoms related to vaginal discomfort during menses. Copper IUD may increase bleeding. Salpingectomy is permanent. Also may do POP but not a LARC.  - Provide information on Mirena IUD, copper IUD, and salpingectomy. - Discuss pain relief for IUD insertion. - Consider Mirena IUD   Abnormal Mammogram Follow-up imaging needed for right breast mass seen by [place] about 2.5 years ago. Prefers location closer to home. - Refer for imaging at [facility] or [facility] - Rx placed for diagnostic mammo but may need to be changed to breast US.   General Health Maintenance - Cervical cancer screening: Discussed screening Q3 years. Reviewed importance of annual exams and limits of pap smear. Pap with reflex HPV done - GC/CT: Discussed. Pt  declines - Gardasil: completed - Breast Health: Encouraged self breast awareness/exams. Teaching provided. - Follow-up: 12 months and prn    Orders Placed This Encounter  Procedures   MM 3D DIAGNOSTIC MAMMOGRAM BILATERAL BREAST    Meds: No orders of the defined types were placed in this encounter.   Follow-up: Return in about 1 year (around 05/20/2024) for annual.  Milas Hock, MD 05/21/2023 10:47 AM

## 2023-05-28 ENCOUNTER — Encounter: Payer: Self-pay | Admitting: Obstetrics and Gynecology

## 2023-05-28 LAB — CYTOLOGY - PAP
Adequacy: ABSENT
Diagnosis: NEGATIVE

## 2023-10-30 DIAGNOSIS — F411 Generalized anxiety disorder: Secondary | ICD-10-CM | POA: Diagnosis not present

## 2023-11-06 DIAGNOSIS — F411 Generalized anxiety disorder: Secondary | ICD-10-CM | POA: Diagnosis not present

## 2023-11-20 DIAGNOSIS — F411 Generalized anxiety disorder: Secondary | ICD-10-CM | POA: Diagnosis not present

## 2023-11-27 DIAGNOSIS — F411 Generalized anxiety disorder: Secondary | ICD-10-CM | POA: Diagnosis not present

## 2023-12-04 DIAGNOSIS — F411 Generalized anxiety disorder: Secondary | ICD-10-CM | POA: Diagnosis not present

## 2023-12-11 DIAGNOSIS — F411 Generalized anxiety disorder: Secondary | ICD-10-CM | POA: Diagnosis not present

## 2023-12-25 DIAGNOSIS — F411 Generalized anxiety disorder: Secondary | ICD-10-CM | POA: Diagnosis not present

## 2024-01-01 DIAGNOSIS — F411 Generalized anxiety disorder: Secondary | ICD-10-CM | POA: Diagnosis not present

## 2024-01-04 DIAGNOSIS — Z13220 Encounter for screening for lipoid disorders: Secondary | ICD-10-CM | POA: Diagnosis not present

## 2024-01-04 DIAGNOSIS — Z1159 Encounter for screening for other viral diseases: Secondary | ICD-10-CM | POA: Diagnosis not present

## 2024-01-04 DIAGNOSIS — Z131 Encounter for screening for diabetes mellitus: Secondary | ICD-10-CM | POA: Diagnosis not present

## 2024-01-04 DIAGNOSIS — Z7689 Persons encountering health services in other specified circumstances: Secondary | ICD-10-CM | POA: Diagnosis not present

## 2024-01-04 DIAGNOSIS — Z0000 Encounter for general adult medical examination without abnormal findings: Secondary | ICD-10-CM | POA: Diagnosis not present

## 2024-01-04 DIAGNOSIS — Z6841 Body Mass Index (BMI) 40.0 and over, adult: Secondary | ICD-10-CM | POA: Diagnosis not present

## 2024-01-04 DIAGNOSIS — K047 Periapical abscess without sinus: Secondary | ICD-10-CM | POA: Diagnosis not present

## 2024-01-04 DIAGNOSIS — E66813 Obesity, class 3: Secondary | ICD-10-CM | POA: Diagnosis not present

## 2024-01-22 DIAGNOSIS — F411 Generalized anxiety disorder: Secondary | ICD-10-CM | POA: Diagnosis not present

## 2024-01-29 DIAGNOSIS — F411 Generalized anxiety disorder: Secondary | ICD-10-CM | POA: Diagnosis not present
